# Patient Record
Sex: Female | Born: 1965 | Race: Black or African American | Hispanic: No | Smoking: Never smoker
Health system: Southern US, Community
[De-identification: ages and names within clinical notes are randomized; demographics above are authoritative.]

## PROBLEM LIST (undated history)

## (undated) DIAGNOSIS — R739 Hyperglycemia, unspecified: Secondary | ICD-10-CM

## (undated) DIAGNOSIS — E119 Type 2 diabetes mellitus without complications: Secondary | ICD-10-CM

## (undated) DIAGNOSIS — I10 Essential (primary) hypertension: Secondary | ICD-10-CM

## (undated) HISTORY — DX: Hyperglycemia, unspecified: R73.9

## (undated) HISTORY — DX: Essential (primary) hypertension: I10

## (undated) HISTORY — DX: Type 2 diabetes mellitus without complications: E11.9

---

## 1998-03-18 ENCOUNTER — Ambulatory Visit (HOSPITAL_COMMUNITY): Admission: RE | Admit: 1998-03-18 | Discharge: 1998-03-18 | Payer: Self-pay | Admitting: Obstetrics and Gynecology

## 1998-06-01 ENCOUNTER — Ambulatory Visit (HOSPITAL_COMMUNITY): Admission: RE | Admit: 1998-06-01 | Discharge: 1998-06-01 | Payer: Self-pay | Admitting: Family Medicine

## 1998-10-21 ENCOUNTER — Ambulatory Visit (HOSPITAL_COMMUNITY): Admission: RE | Admit: 1998-10-21 | Discharge: 1998-10-21 | Payer: Self-pay | Admitting: Obstetrics and Gynecology

## 1998-10-22 ENCOUNTER — Ambulatory Visit (HOSPITAL_COMMUNITY): Admission: RE | Admit: 1998-10-22 | Discharge: 1998-10-22 | Payer: Self-pay | Admitting: Obstetrics and Gynecology

## 1998-10-22 ENCOUNTER — Encounter: Payer: Self-pay | Admitting: Obstetrics and Gynecology

## 1998-10-23 ENCOUNTER — Ambulatory Visit (HOSPITAL_COMMUNITY): Admission: RE | Admit: 1998-10-23 | Discharge: 1998-10-23 | Payer: Self-pay | Admitting: Obstetrics and Gynecology

## 1998-10-25 ENCOUNTER — Encounter: Payer: Self-pay | Admitting: Obstetrics and Gynecology

## 1998-10-25 ENCOUNTER — Ambulatory Visit (HOSPITAL_COMMUNITY): Admission: RE | Admit: 1998-10-25 | Discharge: 1998-10-25 | Payer: Self-pay | Admitting: Obstetrics and Gynecology

## 1998-11-10 ENCOUNTER — Ambulatory Visit (HOSPITAL_COMMUNITY): Admission: RE | Admit: 1998-11-10 | Discharge: 1998-11-10 | Payer: Self-pay | Admitting: Obstetrics and Gynecology

## 1999-09-08 ENCOUNTER — Ambulatory Visit (HOSPITAL_COMMUNITY): Admission: RE | Admit: 1999-09-08 | Discharge: 1999-09-08 | Payer: Self-pay | Admitting: Obstetrics and Gynecology

## 1999-09-08 ENCOUNTER — Encounter (INDEPENDENT_AMBULATORY_CARE_PROVIDER_SITE_OTHER): Payer: Self-pay

## 1999-10-05 ENCOUNTER — Other Ambulatory Visit: Admission: RE | Admit: 1999-10-05 | Discharge: 1999-10-05 | Payer: Self-pay | Admitting: Obstetrics and Gynecology

## 2000-05-18 ENCOUNTER — Encounter: Payer: Self-pay | Admitting: Obstetrics and Gynecology

## 2000-05-18 ENCOUNTER — Ambulatory Visit (HOSPITAL_COMMUNITY): Admission: RE | Admit: 2000-05-18 | Discharge: 2000-05-18 | Payer: Self-pay | Admitting: Obstetrics and Gynecology

## 2000-08-21 ENCOUNTER — Inpatient Hospital Stay (HOSPITAL_COMMUNITY): Admission: AD | Admit: 2000-08-21 | Discharge: 2000-08-21 | Payer: Self-pay | Admitting: Obstetrics and Gynecology

## 2000-10-22 ENCOUNTER — Inpatient Hospital Stay (HOSPITAL_COMMUNITY): Admission: AD | Admit: 2000-10-22 | Discharge: 2000-10-26 | Payer: Self-pay | Admitting: Obstetrics and Gynecology

## 2000-11-22 ENCOUNTER — Other Ambulatory Visit: Admission: RE | Admit: 2000-11-22 | Discharge: 2000-11-22 | Payer: Self-pay | Admitting: Obstetrics and Gynecology

## 2001-12-06 ENCOUNTER — Other Ambulatory Visit: Admission: RE | Admit: 2001-12-06 | Discharge: 2001-12-06 | Payer: Self-pay | Admitting: Obstetrics and Gynecology

## 2002-12-11 ENCOUNTER — Other Ambulatory Visit: Admission: RE | Admit: 2002-12-11 | Discharge: 2002-12-11 | Payer: Self-pay | Admitting: Obstetrics and Gynecology

## 2003-06-23 ENCOUNTER — Encounter: Admission: RE | Admit: 2003-06-23 | Discharge: 2003-09-21 | Payer: Self-pay | Admitting: Family Medicine

## 2003-12-15 ENCOUNTER — Other Ambulatory Visit: Admission: RE | Admit: 2003-12-15 | Discharge: 2003-12-15 | Payer: Self-pay | Admitting: Obstetrics and Gynecology

## 2004-05-24 ENCOUNTER — Inpatient Hospital Stay (HOSPITAL_COMMUNITY): Admission: AD | Admit: 2004-05-24 | Discharge: 2004-05-24 | Payer: Self-pay | Admitting: Obstetrics and Gynecology

## 2004-05-24 ENCOUNTER — Ambulatory Visit (HOSPITAL_COMMUNITY): Admission: RE | Admit: 2004-05-24 | Discharge: 2004-05-24 | Payer: Self-pay | Admitting: Obstetrics and Gynecology

## 2004-10-21 ENCOUNTER — Ambulatory Visit (HOSPITAL_COMMUNITY): Admission: RE | Admit: 2004-10-21 | Discharge: 2004-10-21 | Payer: Self-pay | Admitting: Obstetrics and Gynecology

## 2004-12-23 ENCOUNTER — Other Ambulatory Visit: Admission: RE | Admit: 2004-12-23 | Discharge: 2004-12-23 | Payer: Self-pay | Admitting: Obstetrics and Gynecology

## 2005-03-02 ENCOUNTER — Encounter (HOSPITAL_COMMUNITY): Admission: EM | Admit: 2005-03-02 | Discharge: 2005-04-01 | Payer: Self-pay | Admitting: Obstetrics and Gynecology

## 2005-07-21 IMAGING — US US OB TRANSVAGINAL MODIFY
1 series · 18 of 28 positions shown · non-contrast
Comparison: none

CLINICAL DATA: Threatened AB.  5 week 4 days pregnant per office (patient states her LMP is 04/28/04 = 3 weeks 5 days).  Beta hCG level 3388 on 05/18/04 and 2258 on 05/20/04.  
EARLY OBSTETRICAL ULTRASOUND WITH TRANSVAGINAL:
Transabdominal and transvaginal scanning of the pelvis was performed.  The uterus is enlarged and contains multiple fibroids, several of which are calcified.  No intrauterine pregnancy is identified.  The endometrium is normal in thickness at 5.9 mm.  
Left ovary is normal measuring 3.9 x 1.5 x 1.1 cm.  Right ovary is 3.5 x 1.9 cm.  It contains a 14 x 17 mm hypoechoic area consistent with a hemorrhagic corpus luteum.  
  Adjacent to the right ovary, there is a complex cystic/solid mass measuring 3.2 x 2.5 x 2.5 cm.  Within the central cystic portion of the mass, there is a questionable yolk sac.  The appearance is highly suspicious for ectopic pregnancy.  No free pelvic fluid is noted.  
IMPRESSION
There is no evidence of intrauterine pregnancy.  There is a complex cystic/solid mass adjacent to the right ovary, measuring 3.2 x 2.5 x 2.5 cm.  The appearance is highly suspicious for ectopic pregnancy.
Corpus luteum in the right ovary.  Normal left ovary.  
No free pelvic fluid.
The report was discussed with Dr. Goya.  He asked that the patient be sent to the office for further evaluation.

[Series 1: us ob transvaginal modify · 18 of 52 slices shown]
[im 1/52]
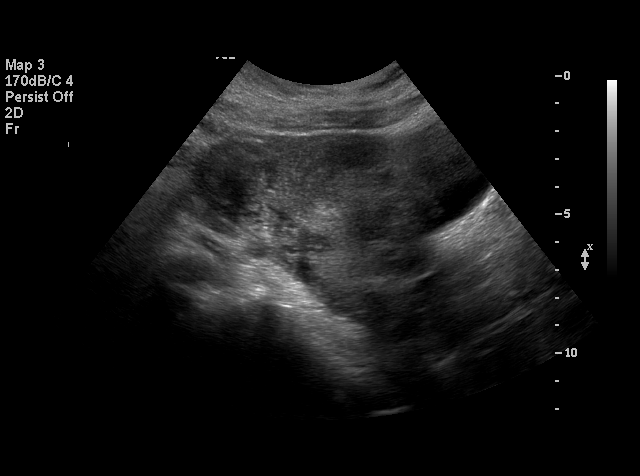
[im 4/52]
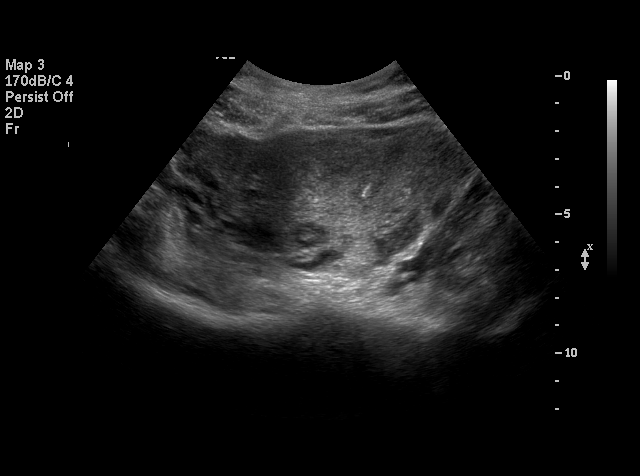
[im 6/52]
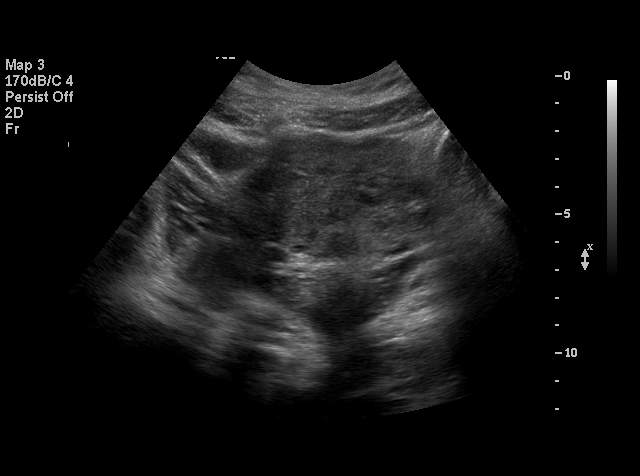
[im 10/52]
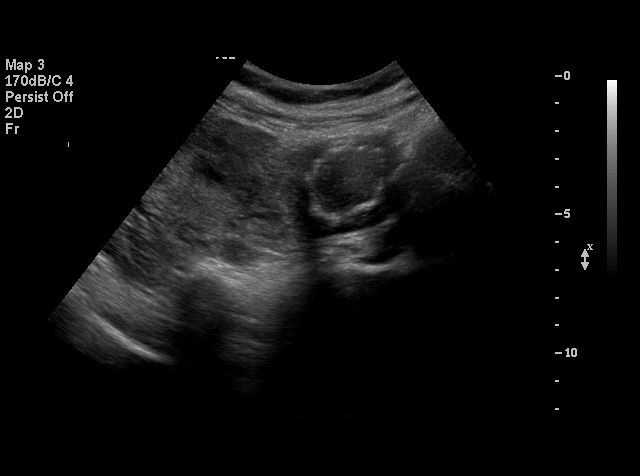
[im 14/52]
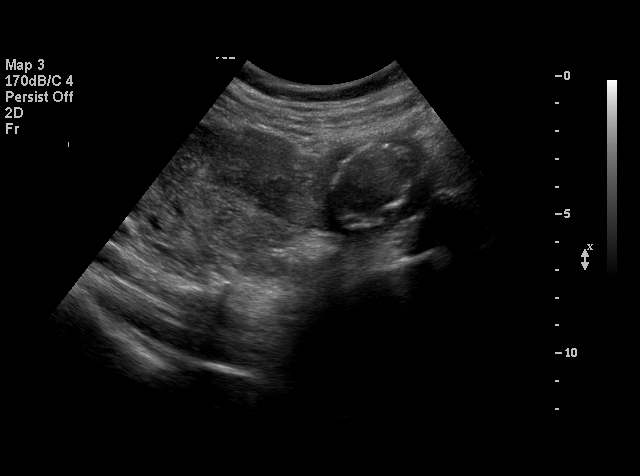
[im 16/52]
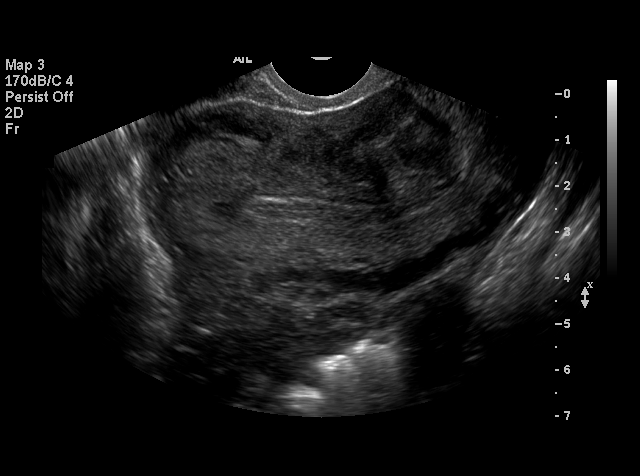
[im 19/52]
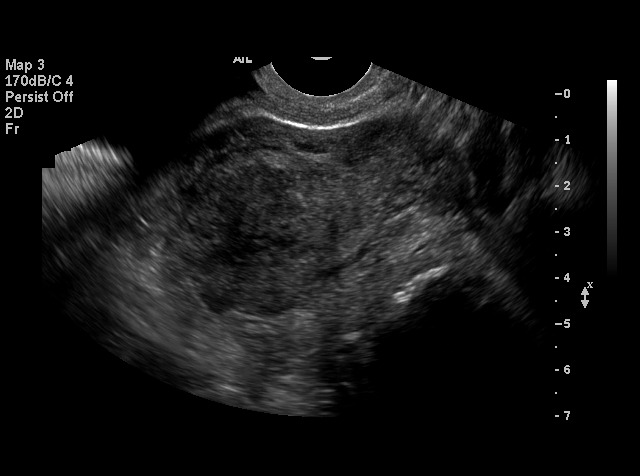
[im 21/52]
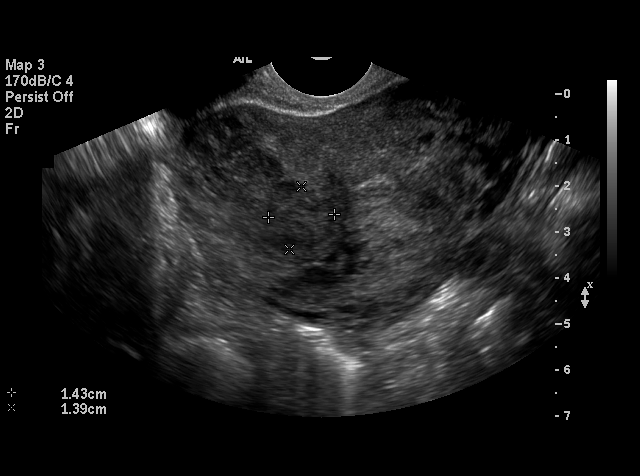
[im 25/52]
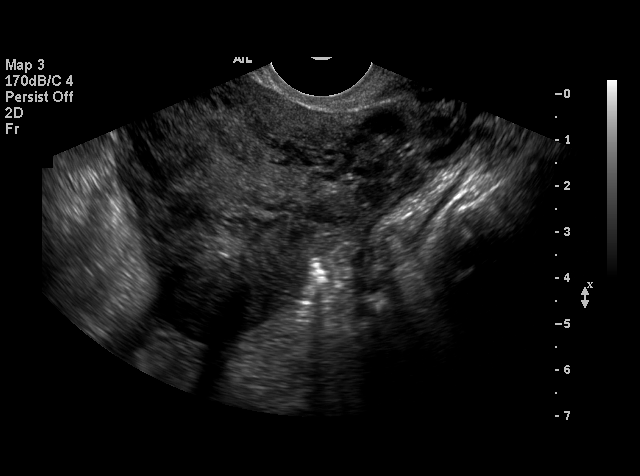
[im 27/52]
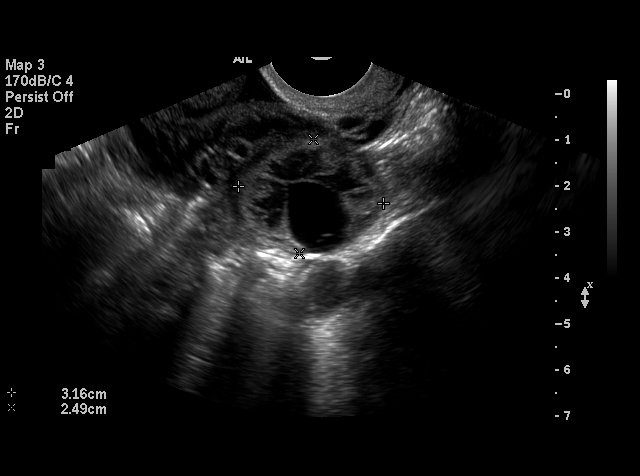
[im 31/52]
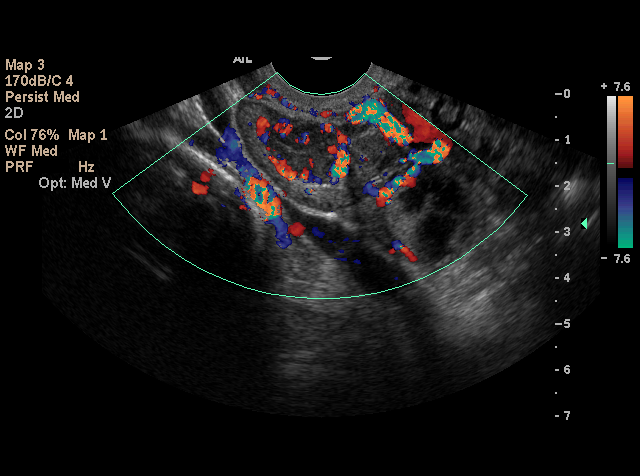
[im 33/52]
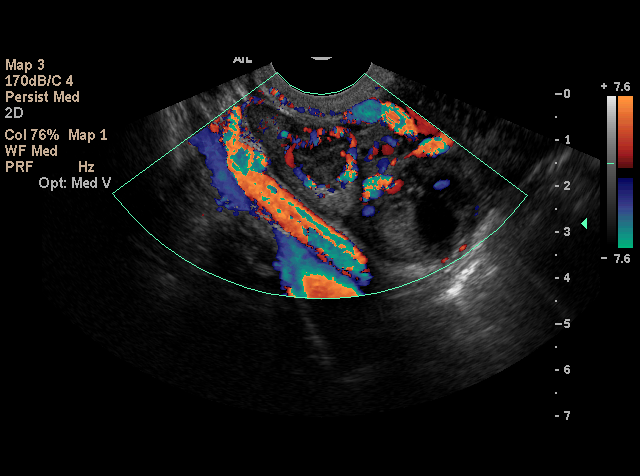
[im 36/52]
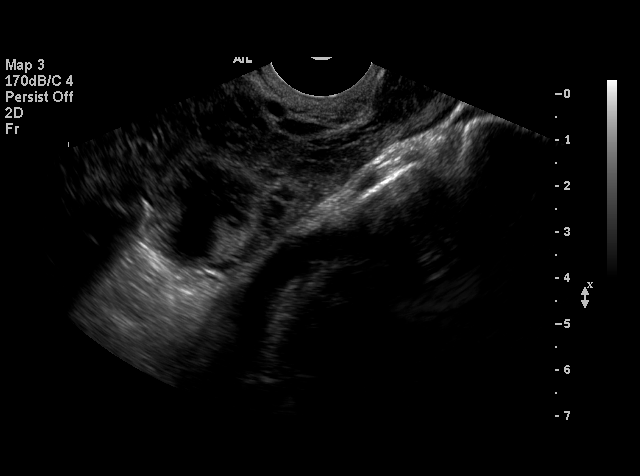
[im 40/52]
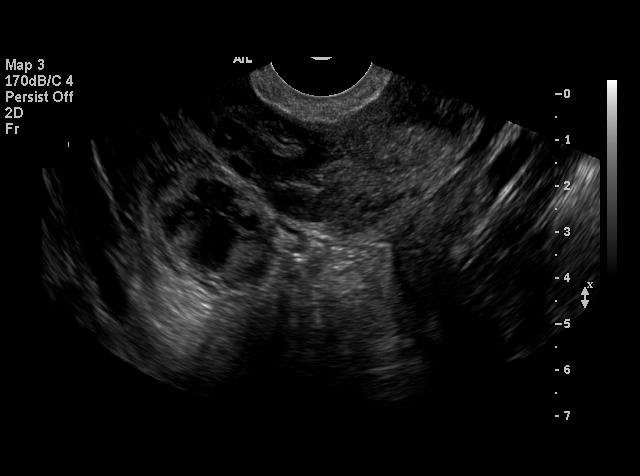
[im 42/52]
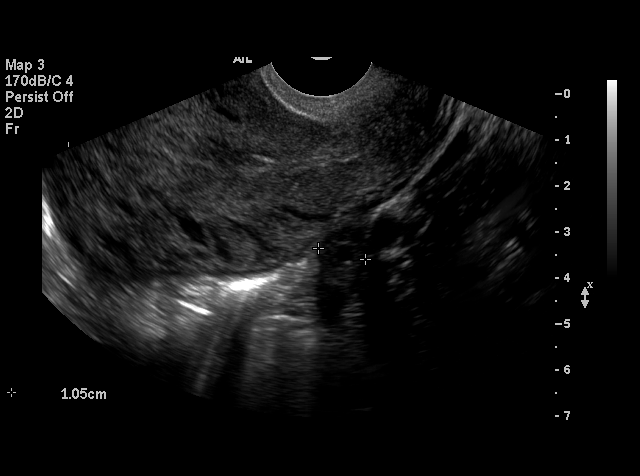
[im 46/52]
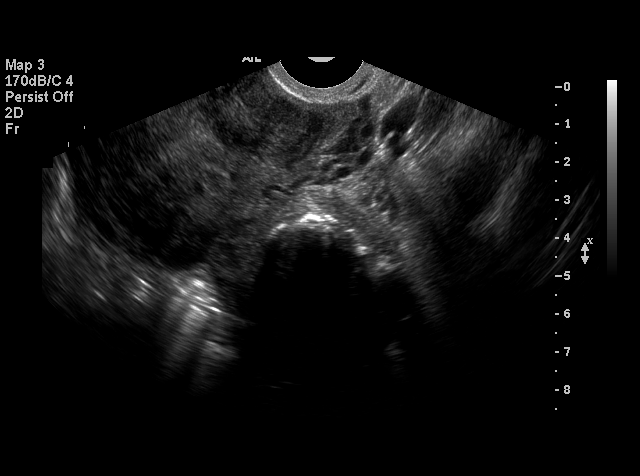
[im 48/52]
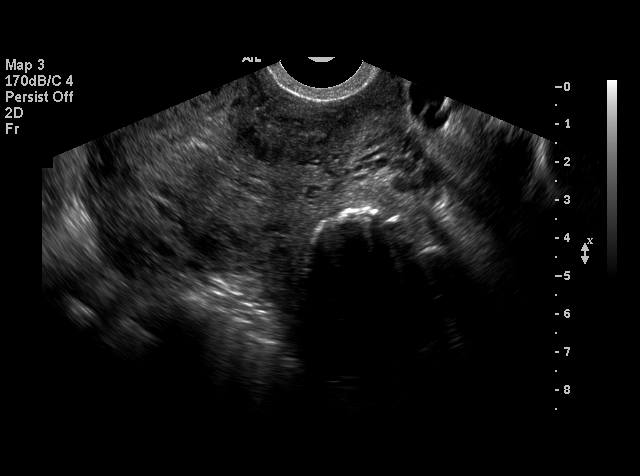
[im 52/52]
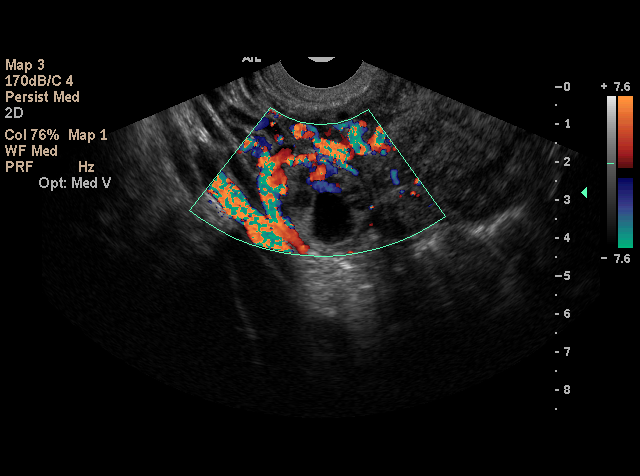

[18 of 28 positions shown; findings below may reference images not displayed]

## 2005-08-08 ENCOUNTER — Encounter: Admission: RE | Admit: 2005-08-08 | Discharge: 2005-09-04 | Payer: Self-pay | Admitting: Obstetrics and Gynecology

## 2005-10-10 ENCOUNTER — Inpatient Hospital Stay (HOSPITAL_COMMUNITY): Admission: RE | Admit: 2005-10-10 | Discharge: 2005-10-13 | Payer: Self-pay | Admitting: Obstetrics and Gynecology

## 2005-10-10 ENCOUNTER — Encounter (INDEPENDENT_AMBULATORY_CARE_PROVIDER_SITE_OTHER): Payer: Self-pay | Admitting: *Deleted

## 2005-12-19 ENCOUNTER — Other Ambulatory Visit: Admission: RE | Admit: 2005-12-19 | Discharge: 2005-12-19 | Payer: Self-pay | Admitting: Obstetrics and Gynecology

## 2008-09-09 ENCOUNTER — Emergency Department (HOSPITAL_BASED_OUTPATIENT_CLINIC_OR_DEPARTMENT_OTHER): Admission: EM | Admit: 2008-09-09 | Discharge: 2008-09-09 | Payer: Self-pay | Admitting: Emergency Medicine

## 2011-03-31 NOTE — Op Note (Signed)
[name], [name] NO.:  [id number]   MEDICAL RECORD NO.:  [id number]          PATIENT TYPE:  INP   LOCATION:  9117                          FACILITY:  [facility]   PHYSICIAN:  Samantha James, M.D.       DATE OF BIRTH:  May 03, 1966   DATE OF PROCEDURE:  10/10/2005  DATE OF DISCHARGE:                                 OPERATIVE REPORT   PREOPERATIVE DIAGNOSIS:  Intrauterine pregnancy at 38-1/2 weeks.  Insulin-  dependent gestational diabetes, desired sterilization.   POSTOPERATIVE DIAGNOSIS:  Intrauterine pregnancy at 38-1/2 weeks.  Insulin-  dependent gestational diabetes, desired sterilization. With delivery of  viable female infant Apgar 09/09.   PROCEDURE:  Repeat low flap transverse cesarean section, bilateral Pomeroy  tubal sterilization.   SURGEON:  Dr. Miguel James.   ASSISTANT:  Carrington Clamp, M.D.   ANESTHESIA:  Spinal.   COMPLICATIONS:  None.   JUSTIFICATION:  The patient is a 45 year old black female gravida 6, para 1-  0-4-1 at 38-1/2 weeks. The patient has been followed during her pregnancy  and has insulin-dependent gestational diabetes, well controlled with  medication and diet. At 38-1/2 weeks having had previous cesarean section,  she is being admitted to undergo elective repeat cesarean section. In  addition she has requested that a sterilization procedure be performed and  has given informed consent for tubal sterilization.   DESCRIPTION OF PROCEDURE:  The patient was taken to the operating room and  placed in sitting position and spinal anesthesia was administered without  difficulty. She was then placed in supine position, deviated to the left,  and prepped and draped in usual sterile fashion. The Foley catheter was  inserted. The previous Pfannenstiel incision was then reincised and incision  was extended down to subcutaneous tissue with bleeding points being clamped  and coagulated as they were encountered. The fascia was then identified,  incised transversely. At this point the fascia was separated from the  underlying rectus muscles. Rectus muscles were divided in midline.  Peritoneum was then found and entered carefully avoiding underlying  structures. At this point a bladder flap was created and protected with a  bladder blade. An incision was made into lower uterine segment and amniotic  cavity was entered. Clear fluid was obtained and at this point the patient  was delivered of viable female infant Apgar 9 at 1 minute and 9 at 5 minutes  from vertex LOA position. Baby was handed to pediatric team in attendance.  Cord bloods were obtained for appropriate studies. After this was done the  placenta was delivered. Uterus was evacuated of any remaining products of  conception and the angles of the uterine incision were ligated using figure-  of-eight sutures of #1 Vicryl. The uterus was closed in layers. The first  layer was running interlocking suture of #1 Vicryl followed by an  imbricating suture of #1 Vicryl. After this was done bladder flap was  reapproximated using running continuous 2-0 Vicryl suture. At this point  attention was directed to the right fallopian tube. It was traced out to  fimbriated end  and was then grasped in its midportion. The segment of tube  was then ligated with two ligatures of 0 plain gut. The tubal segment above  the ligatures was excised and tubal stumps were cauterized. The identical  procedure was then carried out on the left side. 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 At this point lap counts, instrument counts were taken  and found to be correct and then the abdomen was closed. The  parietoperitoneum was closed using running continuous 0 Vicryl suture.  Rectus muscles were reapproximated using running continuous 0 Vicryl suture.  The fascia was closed using two sutures of 0 Vicryl each starting at the   lateral fascial angles and meeting in the midline. Subcutaneous tissue was  closed using interrupted 0 Vicryl suture and skin incision was closed using  staples. Estimated blood loss was approximately 800 mL. The patient  tolerated the procedure well and went to the recovery room in satisfactory  condition.      Samantha James, M.D.  Electronically Signed     AR/MEDQ  D:  10/10/2005  T:  10/10/2005  Job:  [id number]

## 2011-03-31 NOTE — Discharge Summary (Signed)
Samantha James, Samantha James NO.:  [id number]   MEDICAL RECORD NO.:  [id number]          PATIENT TYPE:  INP   LOCATION:  9117                          FACILITY:  [facility]   PHYSICIAN:  Samantha James, M.D.   DATE OF BIRTH:  [birth year]   DATE OF ADMISSION:  10/10/2005  DATE OF DISCHARGE:  10/13/2005                                 DISCHARGE SUMMARY   FINAL DIAGNOSES:  1.  Intrauterine pregnancy at 38.[redacted] weeks gestation.  2.  Insulin dependent gestational diabetes.  3.  The patient desires permanent sterilization.  4.  History of cesarean section.  5.  History of uterine fibroids.   PROCEDURE:  Repeat low flap transverse cesarean section and bilateral  Pomeroy tubal sterilization procedure. Surgeon Dr. Donovan Kail.  Assistant  Dr. Carrington Clamp.   COMPLICATIONS:  None.   HISTORY OF PRESENT ILLNESS:  This 45 year old G6, P 1-0-4-1 presents at 38.[redacted]  weeks gestation for repeat cesarean section. The patient's antepartum course  had been complicated by advanced maternal age.  The patient declined  amniocentesis but had a quad screen performed that came back within normal  limits. The patient was also noted to have gestational diabetes on her  screening and insulin was needed to control this. She was monitored with  nonstress tests which were always reactive. The patient had a negative group  B strep culture obtained in the office at 35 weeks, and she was felt ready  for delivery at 38.5 weeks.  She was taken to the operating room on October 10, 2005 by Dr. Donovan Kail where a repeat low flap transverse cesarean  section was performed with the delivery of an 8 pound 2 ounce female infant  with Apgars of 9/9.  Delivery went without complications.  At this point,  the patient expressed her desire to continue with a bilateral tubal  ligation. This was performed without complications. The patient's  postoperative course was complicated by some postoperative anemia, but the  patient was stable.  Her hemoglobin dropped as low as 7.5, but it was only  9.8 preop. She was felt ready for discharge on postoperative day #3.   DIET:  She was sent home on a regular diet.   ACTIVITY:  She was told to decrease her activities.   MEDICATIONS:  1.  She was told to continue her prenatal vitamins and iron supplement 3      times a day.  2.  She could use Percocet 1-2 every 4 hours as needed for pain.  3.  She was told she could use over-the-counter ibuprofen up to 600      milligrams every 6 hours as needed for pain.   FOLLOW UP:  She was to follow up in the office in 4 weeks.   LABORATORY DATA ON DISCHARGE:  Hemoglobin of 7.5, white blood cell count of  10.3, platelets of 179,000 and normal PIH panel.      Samantha James, P.A.-C.      Samantha James, M.D.  Electronically Signed    MB/MEDQ  D:  11/26/2005  T:  11/27/2005  Job:  [id number]

## 2011-03-31 NOTE — Op Note (Signed)
[facility] [place]  Patient:    Samantha James, Samantha James                        MRN: [id number] Proc. Date: 10/23/00 Adm. Date:  [id number] Attending:  Conley Simmonds A                           Operative Report  PREOPERATIVE DIAGNOSIS:       Intrauterine pregnancy at term, arrest of active stage of labor.  POSTOPERATIVE DIAGNOSIS:      Intrauterine pregnancy at term, arrest of active stage of labor with delivery of viable female infant, Apgars 8 and 9.  PROCEDURE:                    Primary low flap transverse cesarean section.  SURGEON:                      Miguel Aschoff, M.D.  ANESTHESIA:                   Epidural.  COMPLICATIONS:                None.  JUSTIFICATION:                The patient is a 45 year old black female, gravida 4, para 0-0-3-0 at term.  The patient has had prior poor OB history with three ______ trimester spontaneous fetal demises.  She is now at term and being induced.  The patient progressed to 5 cm and then had arrest of labor for three hours with no further progress of dilatation or descent with adequate [place] units.  Because of this arrest of labor, she presents now to undergo primary cesarean section.  PROCEDURE:                    Patient was taken to the operating room, placed in the supine position deviated to the left and prepped and draped in the usual sterile fashion.  A Foley catheter had been previously placed.  A Pfannenstiel incision was then made, extended down through subcutaneous tissue with bleeding points being clamped and coagulated as they were encountered. Fascia was then identified and incised transversely and separated from the underlying rectus muscles.  Rectus muscles were divided in the midline.  The peritoneum was then identified and entered carefully avoiding underlying structures.  The peritoneal incision was then extended, the bladder flap was then created and protected with the bladder blade.  An  elliptical transverse incision was made into the lower uterine segment, the amniotic cavity was entered, clear fluid was obtained and at this point, the patient was delivered a viable female infant, Apgar 8 at 1 minute and 9 and 5 minutes from the vertex ROP position.  Head was asymmetric consistent with significant ______ which was felt to be responsible for the arrest of labor.  There was a nuchal cord x 1 noted.  The babys nose and mouth were suctioned and then the baby was handed to the pediatric team in attendance.  Cord bloods were then obtained for appropriate studies.  Then the placenta was delivered.  The uterus was then evacuated of any remaining products of conception.  The angles of the uterine incision were then ligated using figure-of-eight sutures of #1 Vicryl, then the uterus was closed in layers.  The first layer was running interlocking suture of #  1 Vicryl, followed by an imbricating suture of #1 Vicryl.  The bladder flap was reapproximated using running continuous 2-0 Vicryl suture.  At this point, inspection was made for hemostasis.  Hemostasis was excellent.  Laparotomy counts were taken and found to be correct. Inspection of the uterus showed multiple subserosal fibroids.  Several of them being 5-6 cm in size.  The tubes and ovaries were noted to be within normal limits.  At this point, the abdomen was closed.  The parietal peritoneum was closed using running continuous 0 Vicryl suture.  Rectus muscles were reapproximated using running continuous 0 Vicryl suture.  The fascia was closed using two sutures of 0 Vicryl each starting at the lateral fascial angles and meeting in the midline.  Subcutaneous tissue and skin were closed using staples.  The estimated blood loss was approximately 600 to 700 cc. The patient tolerated the procedure well and went to the recovery room in satisfactory condition. DD:  10/23/00 TD:  10/23/00 Job: [id number]

## 2011-03-31 NOTE — Discharge Summary (Signed)
[facility]   Patient:    Samantha James, Samantha James                        MRN: [id number] Adm. Date:  [id number] Disc. Date: [id number] Attending:  Conley Simmonds A Dictator:   Leilani Able, P.A.                           Discharge Summary  FINAL DIAGNOSES:              Intrauterine pregnancy at term, arrest of the active stage of labor.  PROCEDURE:                    Primary low flap transverse cesarean section.  SURGEON:                      Miguel Aschoff, M.D.  COMPLICATIONS:                None.                                This 45 year old G4, P0-0-3-0 presents at term for induction secondary to decreased fetal movement and measuring size greater than dates secondary to uterine fibroids.  Patient has a history of prior fetal demise at 15 weeks.  She did have an NST in the office last week that was reactive.  Patient was admitted on the evening of the 10th for Cytotec and then on the 11th was begun on Pitocin protocol.  Patient also had an abnormal AFP this pregnancy but the ultrasound showed no evidence of neural tube defects.  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  On the 11th patient was started on her low dose Pitocin protocol. Patient dilated to about 5 cm by 3:30 that afternoon with no descent of the vertex and no further dilation.  She was having an adequate contraction pattern.  At this point it was felt to proceed with a cesarean section secondary to arrest of labor.  Patient was taken to the operating room by Dr. Miguel Aschoff where a primary low flap transverse cesarean section was performed with the delivery of an 8 pound 9 ounce female infant with Apgars of 8 and 9. Delivery went without complications.  There were multiple subserosal fibroids noted.  Patients postoperative course was benign without significant fevers. She was felt ready for discharge on postoperative day #3.  She was sent home on a regular diet.   Told to decrease activities.  Told to continue prenatal vitamins and FeSo4.  She was also given Motrin 600 mg #30 one q.6h. as needed for pain.  Told to follow up in the office in four weeks. DD:  11/19/00 TD:  11/19/00 Job: [id number]

## 2011-05-03 ENCOUNTER — Other Ambulatory Visit: Payer: Self-pay | Admitting: Obstetrics and Gynecology

## 2013-08-01 ENCOUNTER — Other Ambulatory Visit: Payer: Self-pay | Admitting: Obstetrics and Gynecology

## 2013-11-25 ENCOUNTER — Telehealth: Payer: Self-pay | Admitting: Hematology & Oncology

## 2013-11-25 NOTE — Telephone Encounter (Signed)
Left vm w NEW PATIENT today to remind them of their appointment with Dr. Ennever. Also, advised them to bring all meds and insurance information. ° °

## 2013-11-27 ENCOUNTER — Encounter: Payer: Self-pay | Admitting: Hematology & Oncology

## 2013-11-27 ENCOUNTER — Ambulatory Visit: Payer: BC Managed Care – PPO

## 2013-11-27 ENCOUNTER — Other Ambulatory Visit (HOSPITAL_BASED_OUTPATIENT_CLINIC_OR_DEPARTMENT_OTHER): Payer: BC Managed Care – PPO | Admitting: Lab

## 2013-11-27 ENCOUNTER — Ambulatory Visit (HOSPITAL_BASED_OUTPATIENT_CLINIC_OR_DEPARTMENT_OTHER): Payer: BC Managed Care – PPO | Admitting: Hematology & Oncology

## 2013-11-27 VITALS — BP 137/64 | HR 69 | Temp 98.2°F | Resp 14 | Ht 62.0 in | Wt 150.0 lb

## 2013-11-27 DIAGNOSIS — D649 Anemia, unspecified: Secondary | ICD-10-CM

## 2013-11-27 DIAGNOSIS — D509 Iron deficiency anemia, unspecified: Secondary | ICD-10-CM

## 2013-11-27 LAB — CBC WITH DIFFERENTIAL (CANCER CENTER ONLY)
BASO#: 0 10*3/uL (ref 0.0–0.2)
BASO%: 0.5 % (ref 0.0–2.0)
EOS%: 1.5 % (ref 0.0–7.0)
Eosinophils Absolute: 0.1 10*3/uL (ref 0.0–0.5)
HCT: 37.4 % (ref 34.8–46.6)
HEMOGLOBIN: 11.8 g/dL (ref 11.6–15.9)
LYMPH#: 2 10*3/uL (ref 0.9–3.3)
LYMPH%: 50.1 % — ABNORMAL HIGH (ref 14.0–48.0)
MCH: 23 pg — AB (ref 26.0–34.0)
MCHC: 31.6 g/dL — AB (ref 32.0–36.0)
MCV: 73 fL — ABNORMAL LOW (ref 81–101)
MONO#: 0.3 10*3/uL (ref 0.1–0.9)
MONO%: 8.3 % (ref 0.0–13.0)
NEUT#: 1.6 10*3/uL (ref 1.5–6.5)
NEUT%: 39.6 % (ref 39.6–80.0)
Platelets: 234 10*3/uL (ref 145–400)
RBC: 5.13 10*6/uL (ref 3.70–5.32)
RDW: 14.4 % (ref 11.1–15.7)
WBC: 4 10*3/uL (ref 3.9–10.0)

## 2013-11-27 LAB — IRON AND TIBC CHCC
%SAT: 14 % — ABNORMAL LOW (ref 21–57)
Iron: 49 ug/dL (ref 41–142)
TIBC: 363 ug/dL (ref 236–444)
UIBC: 313 ug/dL (ref 120–384)

## 2013-11-27 LAB — CHCC SATELLITE - SMEAR

## 2013-11-27 LAB — FERRITIN CHCC: FERRITIN: 22 ng/mL (ref 9–269)

## 2013-11-27 NOTE — Progress Notes (Signed)
This office note has been dictated.

## 2013-12-01 NOTE — Progress Notes (Signed)
CC:   Juluis RainierElizabeth Barnes, MD  DIAGNOSIS:  Microcytic anemia.  HISTORY OF PRESENT ILLNESS:  Samantha James is a very nice 48 year old African American  female.  She works for the [employer]. She is followed by Dr. Juluis RainierElizabeth Barnes.  Dr. Zachery DauerBarnes has noted that Samantha James has had microcystic indices for her red cells.  Samantha James really has not had much in the way of anemia.  Samantha James says that she does have erratic monthly cycles.  She thinks she is probably is going through the change of life.  She sees Dr. Miguel AschoffAllan Ross of OB/GYN.  Samantha James does have non insulin-dependent diabetes.  Her blood work going back to on November 2014 showed a white cell count of 4.1, hemoglobin 12, hematocrit 37.9, platelet count was 218.  She had a MCV of 73.  Her white cell differential showed 44 segs, 47 lymphocytes, 8 monos.  Again, she has not had any bleeding otherwise.  She has had no change in bowel or bladder habits.  She has had no weight loss or weight gain. She is not a vegetarian.  She has had no rashes.  There has been no cough.  She has had no bony pain.  There has been no dysphagia or odynophagia.  She does have her mammograms routinely.  She has not had a colonoscopy yet.  We were currently asked to see her to try to help identify this microcytic abnormality with her red cells.  PAST MEDICAL HISTORY:  Remarkable for; 1. Non insulin-dependent diabetes. 2. Hyperlipidemia. 3. Vitamin D deficiency.  ALLERGIES:  Penicillin.  MEDICATIONS:  Mevacor 20 mg p.o. daily, Glucophage 500 mg p.o. b.i.d., multivitamins.  SOCIAL HISTORY:  Negative for tobacco use.  There is no real alcohol use.  She has no obvious occupational exposures.  FAMILY HISTORY:  Unremarkable for obvious sickle cell.  She is not aware of anybody with anemia in the family.  I think there is some malignancy in the family.  REVIEW OF SYSTEMS:  As stated in history of present illness.  No additional findings  noted on a 12-system review.  PHYSICAL EXAMINATION:  General:  This is a well-developed, well- nourished African American female in no obvious distress.  Vital Signs: Temperature of 98, pulse 69, respiratory rate 16, blood pressure 137/64, weight is 150 pounds.  Head and Neck:  Normocephalic, atraumatic skull. There are no ocular or oral lesions.  There is no scleral icterus. Pupils are reactive.  She has no mucositis.  There is no glossitis. There is no adenopathy in the neck.  Thyroid is nonpalpable.  Lungs: Clear to percussion and auscultation bilaterally.  Cardiac:  Regular rate and rhythm with a normal S1 and S2.  There are no murmurs, rubs, or bruits.  Abdomen:  Soft.  She has good bowel sounds.  There is no fluid wave.  She has well-healed laparotomy scar from past C-sections appeared.  There is no palpable hepatosplenomegaly.  Back:  No tenderness over the spine, ribs, or hips.  Extremities:  No clubbing, cyanosis, or edema.  She has good range motion of her joints.  She has good pulses in distal extremities.  She has good strength in her arms and legs.  Skin:  No rashes, ecchymoses, or petechia.  Neurological:  No focal neurological deficits.  LABORATORY STUDIES:  White cell count is 4, hemoglobin 11.8, hematocrit 37.4, platelet count 234.  MCV is 73.  Her ferritin is at 22.  Iron saturation is 14%.  Peripheral smear shows a microcytic population of red blood cells. There is minimal anisocytosis and poikilocytosis.  I see no target cells.  She has no nucleated red blood cells.  There are no inclusion bodies.  She has no Rouleaux formation.  There are no schistocytes or spherocytes.  White cells appear normal in morphology and maturation. There is no immature myeloid or lymphoid forms.  She has no atypical lymphocytes.  I see no blasts.  Platelets are adequate in number and size.  Platelets are well granulated.  IMPRESSION:  Samantha James is a very charming 48 year old African  American female.  She is not anemic.  She is microcytic.  I think what is important is that her RDW is normal.  One would have to wonder if she does not have an underlying thalassemia with mild iron deficiency.  One would think that with iron deficiency alone, that there would be an elevated RDW.  Her iron studies do show borderline iron.  She is asymptomatic with this.  Again, she is not anemic.  We will see what the hemoglobinopathy evaluation shows.  I am also sending off an alpha thalassemia genotype assay on her.  I do not suspect anything else going on.  I do think that she may need to have her stools checked.  I would like to make sure that she is not having any other source of blood loss outside of her monthly cycles.  With her having diabetes, she may have a lower bone-marrow report response to anemia.  I have not checked her erythropoietin level; however, because I still think that would be all that important to do at this point.  I reviewed Samantha James's level with her.  I went over my recommendations with her.  Of note, she does have a mildly elevated lymphocyte percentage.  I suspect this is related to her ethnic heritage.  In about 20% of African Americans, they can have a chronically elevated lymphocyte percentage. Again, this is not clinically significant.  Again, reviewed my recommendations with Samantha James.  I went over her lab work with her.  I explained the different possibilities that could be going on.  I do not think she needs any IV iron.  Again, she is not anemic.  She is borderline iron deficient.  I think if she needed any extra iron, she might be able to take it orally.  As nice as Samantha James is , I just do not think we have to get her back to the office.  I just do not see that we would be impacting her health care.  I will give antiinfective once I get results back from hemoglobin evaluation.  I spent a good hour with Samantha James.  It was very  nice talking to her. She was very, very nice.    ______________________________ Josph Macho, M.D. PRE/MEDQ  D:  11/27/2013  T:  11/28/2013  Job:  [id number]

## 2013-12-08 LAB — HEMOGLOBINOPATHY EVALUATION
Hemoglobin Other: 0 %
Hgb A2 Quant: 2.4 % (ref 2.2–3.2)
Hgb A: 97.6 % (ref 96.8–97.8)
Hgb F Quant: 0 % (ref 0.0–2.0)
Hgb S Quant: 0 %

## 2013-12-08 LAB — ALPHA-THALASSEMIA GENOTYPR

## 2014-08-06 ENCOUNTER — Other Ambulatory Visit: Payer: Self-pay | Admitting: Obstetrics and Gynecology

## 2014-08-07 LAB — CYTOLOGY - PAP

## 2016-08-10 ENCOUNTER — Other Ambulatory Visit: Payer: Self-pay | Admitting: Obstetrics and Gynecology

## 2016-08-11 LAB — CYTOLOGY - PAP

## 2016-09-27 ENCOUNTER — Ambulatory Visit: Payer: BC Managed Care – PPO | Attending: Orthopedic Surgery | Admitting: Physical Therapy

## 2016-09-27 ENCOUNTER — Encounter: Payer: Self-pay | Admitting: Physical Therapy

## 2016-09-27 DIAGNOSIS — M6281 Muscle weakness (generalized): Secondary | ICD-10-CM

## 2016-09-27 DIAGNOSIS — M25661 Stiffness of right knee, not elsewhere classified: Secondary | ICD-10-CM | POA: Insufficient documentation

## 2016-09-27 DIAGNOSIS — M25671 Stiffness of right ankle, not elsewhere classified: Secondary | ICD-10-CM | POA: Insufficient documentation

## 2016-09-27 DIAGNOSIS — M25672 Stiffness of left ankle, not elsewhere classified: Secondary | ICD-10-CM

## 2016-09-27 NOTE — Patient Instructions (Addendum)
Strengthening: Quadriceps Set    Tighten muscles on top of thighs by pushing knees down into surface.  Watch for knee cap pulling up. Hold __10__ seconds. Repeat __10__ times per set. Do __1__ sets per session. Do ___1_ sessions per day.  Strengthening: Hip Abductor - Resisted    With band looped around both legs above knees, push thighs apart. Keep belly tight, buttocks flat.  Repeat _10___ times per set. Do __1__ sets per session.  Then  Repeat 2x10 single legs Do __1_ sessions per day.  Balance: Unilateral - [employer] - do next to a counter for safety.     Stand on right foot, hands on hips. Keeping hips level, bend forward as if to touch forehead to wall. Hold __1__ seconds. Relax. Repeat __10__ times per set. Do __3__ sets per session. Do __1__ sessions per day.  Quads / HF, Prone    Lie face down, knees together. Grasp one ankle with same-side hand. Use towel if needed to reach. Gently pull foot toward buttock. Hold __45-60_ seconds. Repeat _1-2__ times per session. Do _1__ sessions per day. Repeat on the other leg.   Gastroc Stretch    Stand with right foot back, leg straight, forward leg bent. Keeping heel on floor, turned slightly out, lean into wall until stretch is felt in calf. Hold __45__ seconds. Repeat __1-2__ times per set. Do __1__ sets per session. Do __1__ sessions per day. Repeat on both legs.  Copyright  VHI. All rights reserved.

## 2016-09-27 NOTE — Therapy (Signed)
[facility] [address]  [address] [place], [place], [address] Phone: [phone]   Fax:  [phone]  Physical Therapy Evaluation  Patient Details  Name: Samantha James MRN: [id number] Date of Birth: 05/20/1966 Referring Provider: Dr Thurston Hole  Encounter Date: 09/27/2016      PT End of Session - 09/27/16 0953    Visit Number 1   Number of Visits 8   Date for PT Re-Evaluation 10/25/16   PT Start Time 0849   PT Stop Time 0953   PT Time Calculation (min) 64 min   Activity Tolerance Patient tolerated treatment well      History reviewed. No pertinent past medical history.  History reviewed. No pertinent surgical history.  There were no vitals filed for this visit.       Subjective Assessment - 09/27/16 0852    Subjective Pt reports h/o Rt knee issues for years after falling down stairs, had injection in Jan 2017 as the knee was getting worse. It didn't seem to help so she has a scope 08/23/16. Since surgery, she has been doing some HEP.  Her concern is she is not sure she is doing them correcltly and not as consistent as she should be.  Currnently finishing up her prednisone and going to start  mobic.    Patient Stated Goals run to chase her dog if needed, walk for exercise , squat and lift things and feel safe.    Currently in Pain? No/denies  reports stiffness in her knee after sitting for while.             [facility] PT Assessment - 09/27/16 0001      Assessment   Medical Diagnosis Rt knee scope, menisectomies   Referring Provider Dr Thurston Hole   Onset Date/Surgical Date 08/23/16   Next MD Visit 10/17/16   Prior Therapy none recent     Precautions   Precautions None     Balance Screen   Has the patient fallen in the past 6 months No     Home Environment   Living Environment Private residence   Home Layout One level  alternates     Prior Function   Level of Independence Independent   Vocation Full time  employment   Vocation Requirements desk job   Leisure walk     Observation/Other Assessments   Focus on Therapeutic Outcomes (FOTO)  42% limited     Observation/Other Assessments-Edema    Edema --  (+) edema in knee RT      Functional Tests   Functional tests Squat;Single leg stance     Squat   Comments adduction of LEs, poor eccentric quads     Single Leg Stance   Comments bilat WNL     ROM / Strength   AROM / PROM / Strength AROM;Strength     AROM   AROM Assessment Site Hip;Knee;Ankle   Right/Left Hip --  bilat WNL   Right/Left Knee Right   Right Knee Extension -9  passive -3   Right Knee Flexion 130  Lt 148   Right/Left Ankle Left;Right  bilat WNL   Right Ankle Dorsiflexion -3  passive 0   Left Ankle Dorsiflexion 3     Strength   Strength Assessment Site Hip;Knee;Ankle   Right/Left Hip Right   Right Hip Flexion 4+/5   Right Hip Extension 5/5   Right Hip ABduction 4/5   Right/Left Knee --  Rt 4+/5, poor quad contraction with  quad sets   Right/Left Ankle --     Flexibility   Soft Tissue Assessment /Muscle Length yes   Hamstrings bilat ~ 90 degrees   Quadriceps prone knee flex Lt 125, Rt 125                   [facility] Adult PT Treatment/Exercise - 09/27/16 0001      Exercises   Exercises Knee/Hip     Knee/Hip Exercises: Stretches   Quad Stretch Right;60 seconds  prone with strap   Gastroc Stretch Both;1 rep;30 seconds     Knee/Hip Exercises: Standing   SLS with FWD leans Rt LE      Knee/Hip Exercises: Supine   Quad Sets Strengthening;Right;10 reps  10 sec holds   Other Supine Knee/Hip Exercises clams with red band bilat hips and single      Modalities   Modalities Vasopneumatic     Vasopneumatic   Number Minutes Vasopneumatic  15 minutes   Vasopnuematic Location  Knee  Rt   Vasopneumatic Pressure Medium   Vasopneumatic Temperature  3*                PT Education - 09/27/16 0927    Education provided Yes   Education  Details HEP   Person(s) Educated Patient   Methods Explanation;Demonstration;Handout   Comprehension Returned demonstration             PT Long Term Goals - 09/27/16 0958      PT LONG TERM GOAL #1   Title I with advanced HEP ( 10/25/16)    Time 4   Period Weeks   Status New     PT LONG TERM GOAL #2   Title tolerate short jogging to simulate chasing her dog with no Rt knee pain ( 10/25/16)    Time 4   Period Weeks   Status New     PT LONG TERM GOAL #3   Title improve bilat ankle dorsiflexion =/> 15 degrees passively ( 10/25/16)    Time 4   Period Weeks   Status New     PT LONG TERM GOAL #4   Title improve Rt knee motion -3 degrees extension to 140 degrees flexion ( 10/25/16)    Time 4   Period Weeks   Status New     PT LONG TERM GOAL #5   Title perform squat with good form and good eccentric quad control ( 10/25/16)    Time 4   Period Weeks     Additional Long Term Goals   Additional Long Term Goals Yes     PT LONG TERM GOAL #6   Title demo prone quad flexibility to within 3" heel to buttock bilaterally ( 10/25/16)    Time 4   Period Weeks   Status New     PT LONG TERM GOAL #7   Title improve FOTO =/< 30% limited ( 10/25/16)    Time 4   Period Weeks   Status New               Plan - 09/27/16 0933    Clinical Impression Statement 50 yo presents 1 month s/p Rt knee scope for menisectomies. She has been performing some exercise at home however still feels limitations and fear to perform all her activity. She has some limited Rt knee motion compared to Lt, weakness in her Rt quad and hip and tightness in her ankles.  There is also edema still present in the Rt knee.  Rehab Potential Excellent   PT Frequency 2x / week   PT Duration 4 weeks   PT Treatment/Interventions Moist Heat;Ultrasound;Therapeutic exercise;Dry needling;Taping;Vasopneumatic Device;Manual techniques;Patient/family education   PT Next Visit Plan progress quad  control/strengthening, Rt knee and ankle ROM    Consulted and Agree with Plan of Care Patient      Patient will benefit from skilled therapeutic intervention in order to improve the following deficits and impairments:  Increased edema, Decreased strength, Pain, Decreased range of motion, Decreased activity tolerance  Visit Diagnosis: Stiffness of right knee, not elsewhere classified - Plan: PT plan of care cert/re-cert  Muscle weakness (generalized) - Plan: PT plan of care cert/re-cert  Stiffness of right ankle, not elsewhere classified - Plan: PT plan of care cert/re-cert  Stiffness of left ankle, not elsewhere classified - Plan: PT plan of care cert/re-cert     Problem List There are no active problems to display for this patient.   Samantha James PT  09/27/2016, 4:06 PM  [facility] [place] [address]  [address] [place], [place], [address] Phone: [phone]   Fax:  [phone]  Name: Samantha James MRN: [id number] Date of Birth: 10-22-1966

## 2016-10-03 ENCOUNTER — Ambulatory Visit: Payer: BC Managed Care – PPO | Admitting: Physical Therapy

## 2016-10-03 DIAGNOSIS — M25671 Stiffness of right ankle, not elsewhere classified: Secondary | ICD-10-CM

## 2016-10-03 DIAGNOSIS — M25661 Stiffness of right knee, not elsewhere classified: Secondary | ICD-10-CM

## 2016-10-03 DIAGNOSIS — M6281 Muscle weakness (generalized): Secondary | ICD-10-CM

## 2016-10-03 DIAGNOSIS — M25672 Stiffness of left ankle, not elsewhere classified: Secondary | ICD-10-CM

## 2016-10-03 NOTE — Therapy (Signed)
[facility] [place] [address]  [address] [place], [place], [address] Phone: [phone]   Fax:  [phone]  Physical Therapy Treatment  Patient Details  Name: Samantha DemarkKaron H Kraemer MRN: [id number] Date of Birth: August 19, 1966 Referring Provider: Dr Thurston HoleWainer  Encounter Date: 10/03/2016      PT End of Session - 10/03/16 1335    Visit Number 2   Number of Visits 8   Date for PT Re-Evaluation 10/25/16   PT Start Time 1310   PT Stop Time 1409   PT Time Calculation (min) 59 min   Activity Tolerance Patient limited by pain   Behavior During Therapy [facility] for tasks assessed/performed      No past medical history on file.  No past surgical history on file.  There were no vitals filed for this visit.      Subjective Assessment - 10/03/16 1309    Subjective Patient feels a tightness in her R knee. Has greatest difficulty with running/jogging - fearful   Patient Stated Goals run to chase her dog if needed, walk for exercise , squat and lift things and feel safe.    Currently in Pain? No/denies  tightness - R knee                         [facility] Adult PT Treatment/Exercise - 10/03/16 0001      Knee/Hip Exercises: Stretches   Passive Hamstring Stretch Right;3 reps;30 seconds   Quad Stretch Right;3 reps;30 seconds     Knee/Hip Exercises: Aerobic   Nustep Level 4 x 6 minutes      Knee/Hip Exercises: Machines for Strengthening   Cybex Knee Extension 20# x 10  15# x 10 eccentric lowering      Knee/Hip Exercises: Standing   Side Lunges Both   Side Lunges Limitations side stepping 20 feet each direction with red tband at ankles   Step Down 10 reps;Step Height: 6"   Step Down Limitations eccentric lowering   Functional Squat 10 reps   Functional Squat Limitations TRX     Knee/Hip Exercises: Supine   Bridges Limitations x 10   Straight Leg Raises Strengthening;Right;10 reps   Straight Leg Raises Limitations VC for full  knee extension   Other Supine Knee/Hip Exercises bridge with B LE extended on peanut ball x 10    Other Supine Knee/Hip Exercises unilateral hip abducction with green tband at knee x 10 each                     PT Long Term Goals - 09/27/16 [id number]      PT LONG TERM GOAL #1   Title I with advanced HEP ( 10/25/16)    Time 4   Period Weeks   Status New     PT LONG TERM GOAL #2   Title tolerate short jogging to simulate chasing her dog with no Rt knee pain ( 10/25/16)    Time 4   Period Weeks   Status New     PT LONG TERM GOAL #3   Title improve bilat ankle dorsiflexion =/> 15 degrees passively ( 10/25/16)    Time 4   Period Weeks   Status New     PT LONG TERM GOAL #4   Title improve Rt knee motion -3 degrees extension to 140 degrees flexion ( 10/25/16)    Time 4   Period Weeks   Status New  PT LONG TERM GOAL #5   Title perform squat with good form and good eccentric quad control ( 10/25/16)    Time 4   Period Weeks     Additional Long Term Goals   Additional Long Term Goals Yes     PT LONG TERM GOAL #6   Title demo prone quad flexibility to within 3" heel to buttock bilaterally ( 10/25/16)    Time 4   Period Weeks   Status New     PT LONG TERM GOAL #7   Title improve FOTO =/< 30% limited ( 10/25/16)    Time 4   Period Weeks   Status New               Plan - 10/03/16 1336    Clinical Impression Statement PT session focusing on quad strengthening with incorporation of eccentric quad control with some discomfort during session, however, quickly resolving. Patient demonstrating reduced full knee extension with reduced quad control. Patient to continue to benefit from PT to progress function and mobility.    PT Treatment/Interventions Moist Heat;Ultrasound;Therapeutic exercise;Dry needling;Taping;Vasopneumatic Device;Manual techniques;Patient/family education   PT Next Visit Plan progress quad control/strengthening, Rt knee and ankle ROM     Consulted and Agree with Plan of Care Patient      Patient will benefit from skilled therapeutic intervention in order to improve the following deficits and impairments:  Increased edema, Decreased strength, Pain, Decreased range of motion, Decreased activity tolerance  Visit Diagnosis: Stiffness of right knee, not elsewhere classified  Muscle weakness (generalized)  Stiffness of right ankle, not elsewhere classified  Stiffness of left ankle, not elsewhere classified     Problem List There are no active problems to display for this patient.     Kipp LaurenceStephanie R Ethelwyn Gilbertson, PT, DPT 10/03/16 2:10 PM     [facility] [place] [address]  [address] [place], [place], [address] Phone: [phone]   Fax:  [phone]  Name: Samantha DemarkKaron H Escamilla MRN: [id number] Date of Birth: 02-Jan-1966

## 2016-10-04 ENCOUNTER — Ambulatory Visit: Payer: BC Managed Care – PPO | Admitting: Physical Therapy

## 2016-10-04 DIAGNOSIS — M6281 Muscle weakness (generalized): Secondary | ICD-10-CM

## 2016-10-04 DIAGNOSIS — M25661 Stiffness of right knee, not elsewhere classified: Secondary | ICD-10-CM

## 2016-10-04 DIAGNOSIS — M25671 Stiffness of right ankle, not elsewhere classified: Secondary | ICD-10-CM

## 2016-10-04 DIAGNOSIS — M25672 Stiffness of left ankle, not elsewhere classified: Secondary | ICD-10-CM

## 2016-10-04 NOTE — Therapy (Signed)
[facility] [place] [address]  [address] [place], [place], [address] Phone: [phone]   Fax:  [phone]  Physical Therapy Treatment  Patient Details  Name: Samantha James MRN: [id number] Date of Birth: February 22, 1966 Referring Provider: Dr Thurston HoleWainer  Encounter Date: 10/04/2016      PT End of Session - 10/04/16 1323    Visit Number 3   Number of Visits 8   Date for PT Re-Evaluation 10/25/16   PT Start Time 1320   PT Stop Time 1417   PT Time Calculation (min) 57 min   Activity Tolerance Patient limited by pain   Behavior During Therapy [facility] for tasks assessed/performed      No past medical history on file.  No past surgical history on file.  There were no vitals filed for this visit.      Subjective Assessment - 10/04/16 1318    Subjective Patient feels well after PT session yesterday. Feels a little tight.    Patient Stated Goals run to chase her dog if needed, walk for exercise , squat and lift things and feel safe.    Currently in Pain? No/denies                         [facility] Adult PT Treatment/Exercise - 10/04/16 1320      Knee/Hip Exercises: Stretches   Gastroc Stretch Both;3 reps;30 seconds   Other Knee/Hip Stretches foam roll to quad - feet straight/in/out - 1 min each     Knee/Hip Exercises: Aerobic   Nustep Level 6 x 6 minutes     Knee/Hip Exercises: Machines for Strengthening   Cybex Knee Extension 25# 2 x 10  20# 10 x 2 with eccentric lowering     Knee/Hip Exercises: Standing   Step Down 15 reps;Step Height: 6"   Step Down Limitations eccentric lowering   Functional Squat 15 reps   Functional Squat Limitations TRX   SLS R SLS on blue mat 3 x 30 seconds     Knee/Hip Exercises: Supine   Bridges Limitations x 15 with green tband at knees   Other Supine Knee/Hip Exercises bridge with B LE extended on peanut ball x 10; progression to bridge with hip/knee flexion on peanut ball x 10   Other Supine Knee/Hip Exercises unilateral hip abducction with green tband at knee x 10 each     Vasopneumatic   Number Minutes Vasopneumatic  15 minutes   Vasopnuematic Location  Knee   Vasopneumatic Pressure High   Vasopneumatic Temperature  coldest temp                     PT Long Term Goals - 09/27/16 [id number]      PT LONG TERM GOAL #1   Title I with advanced HEP ( 10/25/16)    Time 4   Period Weeks   Status New     PT LONG TERM GOAL #2   Title tolerate short jogging to simulate chasing her dog with no Rt knee pain ( 10/25/16)    Time 4   Period Weeks   Status New     PT LONG TERM GOAL #3   Title improve bilat ankle dorsiflexion =/> 15 degrees passively ( 10/25/16)    Time 4   Period Weeks   Status New     PT LONG TERM GOAL #4   Title improve Rt knee motion -3 degrees extension to 140 degrees  flexion ( 10/25/16)    Time 4   Period Weeks   Status New     PT LONG TERM GOAL #5   Title perform squat with good form and good eccentric quad control ( 10/25/16)    Time 4   Period Weeks     Additional Long Term Goals   Additional Long Term Goals Yes     PT LONG TERM GOAL #6   Title demo prone quad flexibility to within 3" heel to buttock bilaterally ( 10/25/16)    Time 4   Period Weeks   Status New     PT LONG TERM GOAL #7   Title improve FOTO =/< 30% limited ( 10/25/16)    Time 4   Period Weeks   Status New               Plan - 10/04/16 1324    Clinical Impression Statement Patient today tolerating progression of hip strengthening task with no increase in pain. Patient only with complaints of mild "tightness." Patient demonstrating good eccentric quad control, however with some visible weakness. Patient to conitnue to benefit form PT to progress function and mobility.    PT Treatment/Interventions Moist Heat;Ultrasound;Therapeutic exercise;Dry needling;Taping;Vasopneumatic Device;Manual techniques;Patient/family education   PT Next Visit Plan  progress quad control/strengthening, Rt knee and ankle ROM    Consulted and Agree with Plan of Care Patient      Patient will benefit from skilled therapeutic intervention in order to improve the following deficits and impairments:  Increased edema, Decreased strength, Pain, Decreased range of motion, Decreased activity tolerance  Visit Diagnosis: Stiffness of right knee, not elsewhere classified  Muscle weakness (generalized)  Stiffness of right ankle, not elsewhere classified  Stiffness of left ankle, not elsewhere classified     Problem List There are no active problems to display for this patient.    Kipp LaurenceStephanie R Ada Woodbury, PT, DPT 10/04/16 2:18 PM    [facility] [place] [address]  [address] [place], [place], [address] Phone: [phone]   Fax:  [phone]  Name: Samantha James MRN: [id number] Date of Birth: Aug 17, 1966

## 2016-10-09 ENCOUNTER — Ambulatory Visit: Payer: BC Managed Care – PPO

## 2016-10-09 DIAGNOSIS — M25672 Stiffness of left ankle, not elsewhere classified: Secondary | ICD-10-CM

## 2016-10-09 DIAGNOSIS — M25671 Stiffness of right ankle, not elsewhere classified: Secondary | ICD-10-CM

## 2016-10-09 DIAGNOSIS — M25661 Stiffness of right knee, not elsewhere classified: Secondary | ICD-10-CM

## 2016-10-09 DIAGNOSIS — M6281 Muscle weakness (generalized): Secondary | ICD-10-CM

## 2016-10-09 NOTE — Therapy (Signed)
[facility] [place] [address]  [address] [place], [place], [address] Phone: [phone]   Fax:  [phone]  Physical Therapy Treatment  Patient Details  Name: Samantha James MRN: [id number] Date of Birth: 08/21/1966 Referring Provider: Dr Thurston HoleWainer  Encounter Date: 10/09/2016      PT End of Session - 10/09/16 1710    Visit Number 4   Number of Visits 8   Date for PT Re-Evaluation 10/25/16   PT Start Time 1705   PT Stop Time 1805   PT Time Calculation (min) 60 min   Activity Tolerance Patient limited by pain   Behavior During Therapy [facility] for tasks assessed/performed      No past medical history on file.  No past surgical history on file.  There were no vitals filed for this visit.      Subjective Assessment - 10/09/16 1747    Subjective Pt. reporting she is, "getting to the HEP activities most nights"    Patient Stated Goals run to chase her dog if needed, walk for exercise , squat and lift things and feel safe.    Currently in Pain? No/denies   Multiple Pain Sites No       Today's treatment:  Therex: Recumbent bike: lvl 2, 6 min  Side stepping with red TB 2 x 20 ft  Monster walk with red TB 2 x 20 ft  Hooklying B adduction ball squeeze with R LAQ 5" x 10 reps  HS curl + bridge with heels on peanut p-ball x 10 reps R 6" eccentric lowering 2 x 10 reps TRX deep squats 2 x 15 reps   Vasoneumatic device to R knee: supine, R LE elevated, coldest temp., medium compression, 10 min           PT Long Term Goals - 10/09/16 1711      PT LONG TERM GOAL #1   Title I with advanced HEP ( 10/25/16)    Time 4   Period Weeks   Status On-going     PT LONG TERM GOAL #2   Title tolerate short jogging to simulate chasing her dog with no Rt knee pain ( 10/25/16)    Time 4   Period Weeks   Status On-going     PT LONG TERM GOAL #3   Title improve bilat ankle dorsiflexion =/> 15 degrees passively ( 10/25/16)    Time 4    Period Weeks   Status On-going     PT LONG TERM GOAL #4   Title improve Rt knee motion -3 degrees extension to 140 degrees flexion ( 10/25/16)    Time 4   Period Weeks   Status New     PT LONG TERM GOAL #5   Title perform squat with good form and good eccentric quad control ( 10/25/16)    Time 4   Period Weeks     PT LONG TERM GOAL #6   Title demo prone quad flexibility to within 3" heel to buttock bilaterally ( 10/25/16)    Time 4   Period Weeks   Status New     PT LONG TERM GOAL #7   Title improve FOTO =/< 30% limited ( 10/25/16)    Time 4   Period Weeks   Status New               Plan - 10/09/16 1722    Clinical Impression Statement Pt. tolerated continued high level hip/knee strengthening activity  well with focus on eccentric quad control activities.  Pt. pain free throughout therex today and reports she consistently gets to most HEP activities in evenings.  Pt. progressing well.   PT Treatment/Interventions Moist Heat;Ultrasound;Therapeutic exercise;Dry needling;Taping;Vasopneumatic Device;Manual techniques;Patient/family education   PT Next Visit Plan progress quad control/strengthening, Rt knee and ankle ROM       Patient will benefit from skilled therapeutic intervention in order to improve the following deficits and impairments:  Increased edema, Decreased strength, Pain, Decreased range of motion, Decreased activity tolerance  Visit Diagnosis: Stiffness of right knee, not elsewhere classified  Muscle weakness (generalized)  Stiffness of right ankle, not elsewhere classified  Stiffness of left ankle, not elsewhere classified     Problem List There are no active problems to display for this patient.   Kermit BaloMicah Suleyman Ehrman, PTA 10/09/16 5:51 PM  [facility] [place] [address]  [address] [place], [place], [address] Phone: [phone]   Fax:  [phone]  Name: Samantha James MRN: [id number] Date of  Birth: [birth year]

## 2016-10-12 ENCOUNTER — Ambulatory Visit: Payer: BC Managed Care – PPO

## 2016-10-12 DIAGNOSIS — M6281 Muscle weakness (generalized): Secondary | ICD-10-CM

## 2016-10-12 DIAGNOSIS — M25661 Stiffness of right knee, not elsewhere classified: Secondary | ICD-10-CM | POA: Diagnosis not present

## 2016-10-12 DIAGNOSIS — M25672 Stiffness of left ankle, not elsewhere classified: Secondary | ICD-10-CM

## 2016-10-12 DIAGNOSIS — M25671 Stiffness of right ankle, not elsewhere classified: Secondary | ICD-10-CM

## 2016-10-12 NOTE — Therapy (Signed)
[facility] [address]  [address] [place], [place], [address] Phone: [phone]   Fax:  [phone]  Physical Therapy Treatment  Patient Details  Name: Samantha James MRN: [id number] Date of Birth: [birth year] Referring Provider: Dr Thurston HoleWainer  Encounter Date: 10/12/2016      PT End of Session - 10/12/16 1453    Visit Number 5   Number of Visits 8   Date for PT Re-Evaluation 10/25/16   PT Start Time 1448   PT Stop Time 1540   PT Time Calculation (min) 52 min   Activity Tolerance Patient limited by pain   Behavior [facility] for tasks assessed/performed      No past medical history on file.  No past surgical history on file.  There were no vitals filed for this visit.      Subjective Assessment - 10/12/16 1452    Subjective Pt. reporting she has been pain free in the R knee since last treatment and is pleased with R knee progress.    Patient Stated Goals run to chase her dog if needed, walk for exercise , squat and lift things and feel safe.    Currently in Pain? No/denies   Multiple Pain Sites No           [facility] Adult PT Treatment/Exercise - 10/12/16 1502      Knee/Hip Exercises: Machines for Strengthening   Cybex Knee Extension 25# x 2 x 10 reps; 2nd set with B conc/R ecc   Cybex Knee Flexion 35# x 2 x 10 reps; 2nd set with B con/ecc   Cybex Leg Press 35# x 10 reps   cues provided for proper technique     Knee/Hip Exercises: Standing   Side Lunges Limitations side stepping 30 feet each direction with green tband at ankles  x 30 ft with monster walk   Step Down 15 reps;Step Height: 6"   Step Down Limitations eccentric lowering   Functional Squat 10 reps  x 15 functional squats   Functional Squat Limitations Alternating lunge x 10 reps each side   Heavy cues required for proper technique   Other Standing Knee Exercises R 6" step up with therapist pulling knee with black looped TB into flexion x 15  reps; focusing on slow descending     Modalities   Modalities Vasopneumatic     Vasopneumatic   Number Minutes Vasopneumatic  10 minutes   Vasopnuematic Location  Knee   Vasopneumatic Pressure Medium   Vasopneumatic Temperature  coldest temp            PT Long Term Goals - 10/12/16 1728      PT LONG TERM GOAL #1   Title I with advanced HEP ( 10/25/16)    Time 4   Period Weeks   Status On-going     PT LONG TERM GOAL #2   Title tolerate short jogging to simulate chasing her dog with no Rt knee pain ( 10/25/16)    Time 4   Period Weeks   Status On-going     PT LONG TERM GOAL #3   Title improve bilat ankle dorsiflexion =/> 15 degrees passively ( 10/25/16)    Time 4   Period Weeks   Status On-going     PT LONG TERM GOAL #4   Title improve Rt knee motion -3 degrees extension to 140 degrees flexion ( 10/25/16)    Time 4   Period Weeks   Status  On-going     PT LONG TERM GOAL #5   Title perform squat with good form and good eccentric quad control ( 10/25/16)    Time 4   Period Weeks   Status On-going     PT LONG TERM GOAL #6   Title demo prone quad flexibility to within 3" heel to buttock bilaterally ( 10/25/16)    Time 4   Period Weeks   Status On-going     PT LONG TERM GOAL #7   Title improve FOTO =/< 30% limited ( 10/25/16)    Time 4   Period Weeks   Status On-going               Plan - 10/12/16 1454    Clinical Impression Statement Pt. able to progress reps and difficulty with all hip/knee strengthening activity today.  Monster walk and side-stepping progressed to green TB and reps increased with all eccentric stepping activity.  Pt. demonstrating improved R quad control and progressing well at this point.   PT Treatment/Interventions Moist Heat;Ultrasound;Therapeutic exercise;Dry needling;Taping;Vasopneumatic Device;Manual techniques;Patient/family education   PT Next Visit Plan Progress quad control/strengthening, Rt knee and ankle ROM       Patient will benefit from skilled therapeutic intervention in order to improve the following deficits and impairments:  Increased edema, Decreased strength, Pain, Decreased range of motion, Decreased activity tolerance  Visit Diagnosis: Stiffness of right knee, not elsewhere classified  Muscle weakness (generalized)  Stiffness of right ankle, not elsewhere classified  Stiffness of left ankle, not elsewhere classified     Problem List There are no active problems to display for this patient.   Kermit BaloMicah Shivaan Tierno, PTA 10/12/16 5:34 PM  [facility] [address]  [address] [place], [place], [address] Phone: [phone]   Fax:  [phone]  Name: Samantha James MRN: [id number] Date of Birth: 1966-03-15

## 2016-10-16 ENCOUNTER — Ambulatory Visit: Payer: BC Managed Care – PPO | Attending: Orthopedic Surgery

## 2016-10-16 DIAGNOSIS — M25672 Stiffness of left ankle, not elsewhere classified: Secondary | ICD-10-CM

## 2016-10-16 DIAGNOSIS — M6281 Muscle weakness (generalized): Secondary | ICD-10-CM | POA: Insufficient documentation

## 2016-10-16 DIAGNOSIS — M25671 Stiffness of right ankle, not elsewhere classified: Secondary | ICD-10-CM | POA: Insufficient documentation

## 2016-10-16 DIAGNOSIS — M25661 Stiffness of right knee, not elsewhere classified: Secondary | ICD-10-CM | POA: Insufficient documentation

## 2016-10-16 NOTE — Therapy (Signed)
[facility] [address]  [place], [place], [address] Phone: [phone]   Fax:  [phone]  Physical Therapy Treatment  Patient Details  Name: Samantha James MRN: [id number] Date of Birth: 12-09-1965 Referring Provider: Dr. Noemi Chapel   Encounter Date: 10/16/2016      PT End of Session - 10/16/16 1712    Visit Number 6   Number of Visits 8   Date for PT Re-Evaluation 10/25/16   PT Start Time 1708  Pt. arrived late   PT Stop Time 1748   PT Time Calculation (min) 40 min   Activity Tolerance Patient limited by pain   Behavior During Therapy [facility] for tasks assessed/performed      No past medical history on file.  No past surgical history on file.  There were no vitals filed for this visit.      Subjective Assessment - 10/16/16 1711    Subjective Pt. reporting R knee is feeling, "the best it has ever felt".     Patient Stated Goals run to chase her dog if needed, walk for exercise , squat and lift things and feel safe.    Currently in Pain? No/denies   Multiple Pain Sites No            [facility] PT Assessment - 10/16/16 1735      Assessment   Medical Diagnosis Rt knee scope, menisectomies   Referring Provider Dr. Noemi Chapel    Onset Date/Surgical Date 08/23/16   Next MD Visit 10/19/16   Prior Therapy none recent     AROM   AROM Assessment Site Knee;Ankle   Right/Left Knee Right   Right Knee Extension 3  3 degrees from neutral    Right Knee Flexion 134   Right/Left Ankle --   Right Ankle Dorsiflexion 13  13 dg dorsiflexion             [facility] Adult PT Treatment/Exercise - 10/16/16 1748      Knee/Hip Exercises: Stretches   Passive Hamstring Stretch Right;30 seconds;1 rep   Sports administrator Right;3 reps;30 seconds   Quad Stretch Limitations with strap   Gastroc Stretch Both;3 reps;30 seconds     Knee/Hip Exercises: Aerobic   Nustep Level 6 x 6 minutes     Knee/Hip Exercises: Machines for  Strengthening   Cybex Knee Extension 35# x 10 reps; B concentric/R eccentric   Cybex Knee Flexion 25# x 10 reps; B concentric/R eccentric      Knee/Hip Exercises: Standing   Side Lunges Limitations side stepping/monster walk 40 feet each direction with green tband at ankles   Step Down 15 reps;Step Height: 6"   Step Down Limitations eccentric lowering   Functional Squat 15 reps   Functional Squat Limitations --  deep squating    Other Standing Knee Exercises --             PT Long Term Goals - 10/16/16 1717      PT LONG TERM GOAL #1   Title I with advanced HEP (10/25/16)    Time 4   Period Weeks   Status On-going     PT LONG TERM GOAL #2   Title tolerate short jogging to simulate chasing her dog with no Rt knee pain ( 10/25/16)    Time 4   Period Weeks   Status Partially Met  10/16/16: Pt. reporting she was able to do light job up driveway without pain.  PT LONG TERM GOAL #3   Title improve bilat ankle dorsiflexion =/> 15 degrees passively ( 10/25/16)    Time 4   Period Weeks   Status On-going  10/16/16: R ankle passive DF 13 dg.       PT LONG TERM GOAL #4   Title improve Rt knee motion -3 degrees extension to 140 degrees flexion ( 10/25/16)    Time 4   Period Weeks   Status On-going  10/16/16: R knee AROM 3-134 dg      PT LONG TERM GOAL #5   Title perform squat with good form and good eccentric quad control ( 10/25/16)    Time 4   Period Weeks   Status Achieved     PT LONG TERM GOAL #6   Title demo prone quad flexibility to within 3" heel to buttock bilaterally ( 10/25/16)    Time 4   Period Weeks   Status On-going  10/16/16: Pt. ~ 4" from buttocks in B quad flex.      PT LONG TERM GOAL #7   Title improve FOTO =/< 30% limited ( 10/25/16)    Time 4   Period Weeks   Status On-going               Plan - 10/16/16 1715    Clinical Impression Statement Pt. progressing well at this point with R knee AROM 3-134 dg today and R DF PROM 13 dg.  Pt.  demonstrating improved quad eccentric stability with symmetrical squat form today.  Pt. R quad flexibility still lacking as compared with L however pt. reporting adherence to quad and PF HEP stretching.  Pt. able to jog short distance in driveway without R knee pain yesterday and seems to be progressing well at this point.     PT Treatment/Interventions Moist Heat;Ultrasound;Therapeutic exercise;Dry needling;Taping;Vasopneumatic Device;Manual techniques;Patient/family education   PT Next Visit Plan Progress quad control/strengthening, Rt knee and ankle ROM      Patient will benefit from skilled therapeutic intervention in order to improve the following deficits and impairments:  Increased edema, Decreased strength, Pain, Decreased range of motion, Decreased activity tolerance  Visit Diagnosis: Stiffness of right knee, not elsewhere classified  Muscle weakness (generalized)  Stiffness of right ankle, not elsewhere classified  Stiffness of left ankle, not elsewhere classified     Problem List There are no active problems to display for this patient.   Bess Harvest, PTA 10/16/16 6:15 PM  [facility] [address]  [place], [place], [address] Phone: [phone]   Fax:  [phone]  Name: Samantha James MRN: [id number] Date of Birth: 04/09/1966

## 2016-10-19 ENCOUNTER — Ambulatory Visit: Payer: BC Managed Care – PPO

## 2016-10-19 ENCOUNTER — Encounter: Payer: BC Managed Care – PPO | Admitting: Physical Therapy

## 2016-10-23 ENCOUNTER — Ambulatory Visit: Payer: BC Managed Care – PPO | Admitting: Physical Therapy

## 2016-10-23 DIAGNOSIS — M25661 Stiffness of right knee, not elsewhere classified: Secondary | ICD-10-CM

## 2016-10-23 DIAGNOSIS — M25671 Stiffness of right ankle, not elsewhere classified: Secondary | ICD-10-CM

## 2016-10-23 DIAGNOSIS — M25672 Stiffness of left ankle, not elsewhere classified: Secondary | ICD-10-CM

## 2016-10-23 DIAGNOSIS — M6281 Muscle weakness (generalized): Secondary | ICD-10-CM

## 2016-10-23 NOTE — Therapy (Signed)
[facility] [address]  [place], [place], [address] Phone: [phone]   Fax:  [phone]  Physical Therapy Treatment  Patient Details  Name: Samantha James MRN: [id number] Date of Birth: 01-12-1966 Referring Provider: Dr. Noemi Chapel   Encounter Date: 10/23/2016      PT End of Session - 10/23/16 1707    Visit Number 7   Number of Visits 12   Date for PT Re-Evaluation 11/10/16   PT Start Time 1703   PT Stop Time 1744   PT Time Calculation (min) 41 min   Activity Tolerance Patient tolerated treatment well   Behavior During Therapy [facility] for tasks assessed/performed      No past medical history on file.  No past surgical history on file.  There were no vitals filed for this visit.      Subjective Assessment - 10/23/16 1706    Subjective Overall knee is doing well - did have some throbbing pain over the weekend - was on her feet a lot   Patient Stated Goals run to chase her dog if needed, walk for exercise , squat and lift things and feel safe.    Currently in Pain? No/denies   Multiple Pain Sites No            [facility] PT Assessment - 10/23/16 0001      AROM   Right/Left Knee Right   Right Knee Extension 3   Right Knee Flexion 134   Right Ankle Dorsiflexion 13                     [facility] Adult PT Treatment/Exercise - 10/23/16 1718      Knee/Hip Exercises: Stretches   Quad Stretch Right;3 reps;30 seconds   Quad Stretch Limitations with strap     Knee/Hip Exercises: Aerobic   Stationary Bike level 3 x 8 minutes     Knee/Hip Exercises: Machines for Strengthening   Cybex Knee Extension 25# x 15 reps; B concentric/R eccentric   Cybex Knee Flexion 25# x 15 reps; B concentric/R eccentric      Knee/Hip Exercises: Standing   Side Lunges Limitations side stepping/monster walk 40 feet each direction with green tband at ankles; forward/backward monster walks x 40 feet green tbans   Step Down  Right;15 reps;Step Height: 8"   Step Down Limitations eccentric lowering   Functional Squat 15 reps   Functional Squat Limitations TRX   SLS R SLS on blue mat 4 x 30 seconds     Knee/Hip Exercises: Supine   Single Leg Bridge Strengthening;Right;10 reps  with R foot on foam roller                     PT Long Term Goals - 10/23/16 1801      PT LONG TERM GOAL #1   Title I with advanced HEP (11/10/16)    Time 4   Period Weeks   Status On-going     PT LONG TERM GOAL #2   Title tolerate short jogging to simulate chasing her dog with no Rt knee pain ( 11/10/16)    Time 4   Period Weeks   Status Partially Met  10/16/16: Pt. reporting she was able to do light job up driveway without pain.       PT LONG TERM GOAL #3   Title improve bilat ankle dorsiflexion =/> 15 degrees passively ( 11/10/16)    Time 4  Period Weeks   Status On-going  10/16/16: R ankle passive DF 13 dg.       PT LONG TERM GOAL #4   Title improve Rt knee motion -3 degrees extension to 140 degrees flexion ( 11/10/16)    Time 4   Period Weeks   Status On-going  10/23/16: R knee AROM 3-134 dg      PT LONG TERM GOAL #5   Title perform squat with good form and good eccentric quad control ( 10/25/16)    Time 4   Period Weeks   Status Achieved     PT LONG TERM GOAL #6   Title demo prone quad flexibility to within 3" heel to buttock bilaterally ( 11/10/16)    Time 4   Period Weeks   Status On-going  10/16/16: Pt. ~ 3.5" from buttocks in B quad flex.      PT LONG TERM GOAL #7   Title improve FOTO =/< 30% limited ( 10/25/16)    Time 4   Period Weeks   Status On-going               Plan - 10/23/16 1803    Clinical Impression Statement Patient has progressed very well as a result of skilled PT intervention, however, patient conintues to lack full R knee extension as well as R knee flexion equal to that of L. Patient also with some visible quad and HS weakness that continues to limit patient  with jogging and overall mobility. Patient to benefit from extension of plan of care for an additional 4 visits to continue to progress R knee/ankle ROM and strength to maximize patients function to allow her to return to PLOF with reduced pain and proper gait/running mechanics.    Rehab Potential Excellent   PT Frequency 2x / week   PT Duration 4 weeks   PT Treatment/Interventions Moist Heat;Ultrasound;Therapeutic exercise;Dry needling;Taping;Vasopneumatic Device;Manual techniques;Patient/family education   PT Next Visit Plan Progress quad control/strengthening, Rt knee and ankle ROM; progress jogging   Consulted and Agree with Plan of Care Patient      Patient will benefit from skilled therapeutic intervention in order to improve the following deficits and impairments:  Increased edema, Decreased strength, Pain, Decreased range of motion, Decreased activity tolerance  Visit Diagnosis: Stiffness of right knee, not elsewhere classified - Plan: PT plan of care cert/re-cert  Muscle weakness (generalized) - Plan: PT plan of care cert/re-cert  Stiffness of right ankle, not elsewhere classified - Plan: PT plan of care cert/re-cert  Stiffness of left ankle, not elsewhere classified - Plan: PT plan of care cert/re-cert     Problem List There are no active problems to display for this patient.      Lanney Gins, PT, DPT 10/23/16 6:14 PM    [facility] [address]  [place], [place], [address] Phone: [phone]   Fax:  [phone]  Name: Samantha James MRN: [id number] Date of Birth: 1966-05-27

## 2016-10-26 ENCOUNTER — Ambulatory Visit: Payer: BC Managed Care – PPO

## 2016-10-26 DIAGNOSIS — M25661 Stiffness of right knee, not elsewhere classified: Secondary | ICD-10-CM | POA: Diagnosis not present

## 2016-10-26 DIAGNOSIS — M25672 Stiffness of left ankle, not elsewhere classified: Secondary | ICD-10-CM

## 2016-10-26 DIAGNOSIS — M25671 Stiffness of right ankle, not elsewhere classified: Secondary | ICD-10-CM

## 2016-10-26 DIAGNOSIS — M6281 Muscle weakness (generalized): Secondary | ICD-10-CM

## 2016-10-26 NOTE — Therapy (Signed)
[facility] [address]  [place], [place], [address] Phone: [phone]   Fax:  [phone]  Physical Therapy Treatment  Patient Details  Name: Samantha James MRN: [id number] Date of Birth: [birth year] Referring Provider: Dr. Noemi Chapel   Encounter Date: 10/26/2016      PT End of Session - 10/26/16 1508    Visit Number 8   Number of Visits 12   Date for PT Re-Evaluation 11/10/16   PT Start Time 5364  pt. arrived late    PT Stop Time 1530   PT Time Calculation (min) 36 min   Activity Tolerance Patient tolerated treatment well   Behavior During Therapy [facility] for tasks assessed/performed      No past medical history on file.  No past surgical history on file.  There were no vitals filed for this visit.      Subjective Assessment - 10/26/16 1454    Subjective Pt. reporting she has stiffness in R knee following prolonged sitting at work at this point however no pain.     Patient Stated Goals run to chase her dog if needed, walk for exercise , squat and lift things and feel safe.    Currently in Pain? No/denies   Multiple Pain Sites No           [facility] Adult PT Treatment/Exercise - 10/26/16 1458      Knee/Hip Exercises: Aerobic   Stationary Bike level 4 x 8 minutes     Knee/Hip Exercises: Machines for Strengthening   Cybex Knee Extension 35# x 10 reps; B con/ecc; 2nd B conc/R ecc   Cybex Knee Flexion 35# x 15 reps; B con/ecc; 10 reps B con/R ecc     Knee/Hip Exercises: Standing   Side Lunges Limitations side stepping/monster walk 30 feet each direction with blue tband at ankles; forward/backward monster walks x 30 feet blue tbans   Functional Squat 15 reps   Functional Squat Limitations TRX + heel raise    SLS R single leg stance with cone touch 2 x 4 x 5 cones    Other Standing Knee Exercises 8" riser side step over x 20 reps              PT Long Term Goals - 10/23/16 1801      PT LONG TERM GOAL  #1   Title I with advanced HEP (11/10/16)    Time 4   Period Weeks   Status On-going     PT LONG TERM GOAL #2   Title tolerate short jogging to simulate chasing her dog with no Rt knee pain ( 11/10/16)    Time 4   Period Weeks   Status Partially Met  10/16/16: Pt. reporting she was able to do light job up driveway without pain.       PT LONG TERM GOAL #3   Title improve bilat ankle dorsiflexion =/> 15 degrees passively ( 11/10/16)    Time 4   Period Weeks   Status On-going  10/16/16: R ankle passive DF 13 dg.       PT LONG TERM GOAL #4   Title improve Rt knee motion -3 degrees extension to 140 degrees flexion ( 11/10/16)    Time 4   Period Weeks   Status On-going  10/23/16: R knee AROM 3-134 dg      PT LONG TERM GOAL #5   Title perform squat with good form and good eccentric quad control (  10/25/16)    Time 4   Period Weeks   Status Achieved     PT LONG TERM GOAL #6   Title demo prone quad flexibility to within 3" heel to buttock bilaterally ( 11/10/16)    Time 4   Period Weeks   Status On-going  10/16/16: Pt. ~ 3.5" from buttocks in B quad flex.      PT LONG TERM GOAL #7   Title improve FOTO =/< 30% limited ( 10/25/16)    Time 4   Period Weeks   Status On-going               Plan - 10/26/16 1525    Clinical Impression Statement Pt. tolerated progression in all hip/knee strengthening activity well today with only mild R knee pain reported which self-resolved quickly.  Sidestepping, monster walk progressed to blue TB today and weight with machine LE strengthening progressed without issue.  Will plan to attempt light jogging next treatment per tolerance.  Pt. progressing well at this point.     PT Treatment/Interventions Moist Heat;Ultrasound;Therapeutic exercise;Dry needling;Taping;Vasopneumatic Device;Manual techniques;Patient/family education   PT Next Visit Plan Progress jogging; Progress quad control/strengthening, Rt knee and ankle ROM      Patient will  benefit from skilled therapeutic intervention in order to improve the following deficits and impairments:  Increased edema, Decreased strength, Pain, Decreased range of motion, Decreased activity tolerance  Visit Diagnosis: Stiffness of right knee, not elsewhere classified  Muscle weakness (generalized)  Stiffness of right ankle, not elsewhere classified  Stiffness of left ankle, not elsewhere classified     Problem List There are no active problems to display for this patient.   Bess Harvest, PTA 10/26/16 5:53 PM  [facility] [address]  [place], [place], [address] Phone: [phone]   Fax:  [phone]  Name: CYNTHIS PURINGTON MRN: [id number] Date of Birth: 10-19-1966

## 2016-10-31 ENCOUNTER — Ambulatory Visit: Payer: BC Managed Care – PPO

## 2016-10-31 DIAGNOSIS — M6281 Muscle weakness (generalized): Secondary | ICD-10-CM

## 2016-10-31 DIAGNOSIS — M25671 Stiffness of right ankle, not elsewhere classified: Secondary | ICD-10-CM

## 2016-10-31 DIAGNOSIS — M25661 Stiffness of right knee, not elsewhere classified: Secondary | ICD-10-CM | POA: Diagnosis not present

## 2016-10-31 DIAGNOSIS — M25672 Stiffness of left ankle, not elsewhere classified: Secondary | ICD-10-CM

## 2016-10-31 NOTE — Therapy (Signed)
[facility] [address]  [place], [place], [address] Phone: [phone]   Fax:  [phone]  Physical Therapy Treatment  Patient Details  Name: Samantha James MRN: [id number] Date of Birth: 03/21/1966 Referring Provider: Dr. Noemi Chapel   Encounter Date: 10/31/2016      PT End of Session - 10/31/16 1703    Visit Number 9   Number of Visits 12   Date for PT Re-Evaluation 11/10/16   PT Start Time 1702   PT Stop Time 1745   PT Time Calculation (min) 43 min   Activity Tolerance Patient tolerated treatment well   Behavior During Therapy [facility] for tasks assessed/performed      No past medical history on file.  No past surgical history on file.  There were no vitals filed for this visit.      Subjective Assessment - 10/31/16 1701    Subjective Pt. reporting R knee is feeling good today.     Patient Stated Goals run to chase her dog if needed, walk for exercise , squat and lift things and feel safe.    Currently in Pain? No/denies   Multiple Pain Sites No            [facility] PT Assessment - 10/31/16 1708      Assessment   Medical Diagnosis Rt knee scope, menisectomies   Referring Provider Dr. Noemi Chapel    Onset Date/Surgical Date 08/23/16   Next MD Visit ~ 11/19/16   Prior Therapy none recent              [facility] Adult PT Treatment/Exercise - 10/31/16 1705      Knee/Hip Exercises: Stretches   Quad Stretch Right;3 reps;30 seconds   Quad Stretch Limitations with strap and bolster under thigh to enhance stretch     Knee/Hip Exercises: Aerobic   Stationary Bike level 5 x 6 minutes   Tread Mill Treadmill: 3 min; 2.0lvl (30 sec), 3.0 (30 sec), 4.0 (30 sec), 4.5 (90 sec)     Knee/Hip Exercises: Standing   Side Lunges Both   Side Lunges Limitations side stepping/monster walk 60 feet each direction with blue tband at ankles; forward/backward monster walks x 60 feet blue tbans   Forward Step Up 1 set;Step Height:  8";15 reps   Forward Step Up Limitations with black TB pulling into flexion with therapist resistance   Other Standing Knee Exercises forward step up on 8" riser with 6# dumbbells and alternating hip flexion x 15 reps; emphasis on eccentric control with step back    Other Standing Knee Exercises R step up onto foam oval on 10" stool with alternating SLR hip abduction, flexion, adduction x 10 reps each with green TB around L             PT Long Term Goals - 10/31/16 1724      PT LONG TERM GOAL #1   Title I with advanced HEP (11/10/16)    Time 4   Period Weeks   Status On-going     PT LONG TERM GOAL #2   Title tolerate short jogging to simulate chasing her dog with no Rt knee pain ( 11/10/16)    Time 4   Period Weeks   Status Partially Met  10/16/16: Pt. reporting she was able to do light job up driveway without pain.       PT LONG TERM GOAL #3   Title improve bilat ankle dorsiflexion =/> 15 degrees passively (  11/10/16)    Time 4   Period Weeks   Status On-going  10/16/16: R ankle passive DF 13 dg.       PT LONG TERM GOAL #4   Title improve Rt knee motion -3 degrees extension to 140 degrees flexion ( 11/10/16)    Time 4   Period Weeks   Status On-going  10/23/16: R knee AROM 3-134 dg      PT LONG TERM GOAL #5   Title perform squat with good form and good eccentric quad control ( 10/25/16)    Time 4   Period Weeks   Status Achieved     PT LONG TERM GOAL #6   Title demo prone quad flexibility to within 3" heel to buttock bilaterally ( 11/10/16)    Time 4   Period Weeks   Status On-going  10/16/16: Pt. ~ 3.5" from buttocks in B quad flex.      PT LONG TERM GOAL #7   Title improve FOTO =/< 30% limited ( 10/25/16)    Time 4   Period Weeks   Status On-going               Plan - 10/31/16 1706    Clinical Impression Statement  Pt. able to perform short jog on treadmill at 4.5 mph with only mild discomfort at R knee today.  Standing R hip/knee strengthening  progressed to incorporate more proprioception today. Pt. tolerated all activities well today however reporting mild discomfort in R knee following treatment.  Pt. reporting she will ice R knee at home.   PT Treatment/Interventions Moist Heat;Ultrasound;Therapeutic exercise;Dry needling;Taping;Vasopneumatic Device;Manual techniques;Patient/family education   PT Next Visit Plan 10th VISIT FOTO; Progress jogging; Progress quad control/strengthening, Rt knee and ankle ROM      Patient will benefit from skilled therapeutic intervention in order to improve the following deficits and impairments:  Increased edema, Decreased strength, Pain, Decreased range of motion, Decreased activity tolerance  Visit Diagnosis: Stiffness of right knee, not elsewhere classified  Muscle weakness (generalized)  Stiffness of right ankle, not elsewhere classified  Stiffness of left ankle, not elsewhere classified     Problem List There are no active problems to display for this patient.   Bess Harvest, PTA 10/31/16 6:01 PM  [facility] [address]  [place], [place], [address] Phone: [phone]   Fax:  [phone]  Name: Samantha James MRN: [id number] Date of Birth: Jan 19, 1966

## 2016-11-02 ENCOUNTER — Ambulatory Visit: Payer: BC Managed Care – PPO

## 2016-11-02 DIAGNOSIS — M25661 Stiffness of right knee, not elsewhere classified: Secondary | ICD-10-CM

## 2016-11-02 DIAGNOSIS — M6281 Muscle weakness (generalized): Secondary | ICD-10-CM

## 2016-11-02 DIAGNOSIS — M25672 Stiffness of left ankle, not elsewhere classified: Secondary | ICD-10-CM

## 2016-11-02 DIAGNOSIS — M25671 Stiffness of right ankle, not elsewhere classified: Secondary | ICD-10-CM

## 2016-11-02 NOTE — Therapy (Signed)
[facility] [address]  [place], [place], [address] Phone: [phone]   Fax:  [phone]  Physical Therapy Treatment  Patient Details  Name: Samantha James MRN: [id number] Date of Birth: 1966-04-19 Referring Provider: Dr. Noemi Chapel   Encounter Date: 11/02/2016      PT End of Session - 11/02/16 1451    Visit Number 10   Number of Visits 12   Date for PT Re-Evaluation 11/10/16   PT Start Time 6759   PT Stop Time 1527   PT Time Calculation (min) 40 min   Activity Tolerance Patient tolerated treatment well   Behavior During Therapy [facility] for tasks assessed/performed      No past medical history on file.  No past surgical history on file.  There were no vitals filed for this visit.      Subjective Assessment - 11/02/16 1450    Subjective Pt. reporting mild R knee pain today.   Patient Stated Goals run to chase her dog if needed, walk for exercise , squat and lift things and feel safe.    Currently in Pain? No/denies   Multiple Pain Sites No            [facility] PT Assessment - 11/02/16 1517      Observation/Other Assessments   Focus on Therapeutic Outcomes (FOTO)  66 % status (34% limitation)           [facility] Adult PT Treatment/Exercise - 11/02/16 1458      Knee/Hip Exercises: Stretches   Quad Stretch Right;3 reps;30 seconds   Quad Stretch Limitations with strap and bolster under thigh to enhance stretch   Gastroc Stretch Both;30 seconds;1 rep   Press photographer Limitations wiht therapist      Knee/Hip Exercises: Aerobic   Stationary Bike level 5 x 6 minutes   Tread Mill Treadmill: 3 min; 2.5lvl (30 sec), 3.5 (30 sec), 4.0 (30 sec), 4.5 (90 sec)     Knee/Hip Exercises: Standing   Side Lunges Both   Side Lunges Limitations side stepping/monster walk 60 feet each direction with blue tband at ankles; forward/backward monster walks x 60 feet blue tbans   Forward Step Up 1 set;Step Height: 8";15 reps    Forward Step Up Limitations with black TB pulling into flexion with therapist resistance   Step Down Right;15 reps;Step Height: 8"   Step Down Limitations eccentric lowering   Functional Squat 15 reps  wide base    Functional Squat Limitations TRX + heel raise      Manual Therapy   Manual Therapy Soft tissue mobilization   Manual therapy comments Strumming to R RF in mod thomas position) x 2 min             PT Long Term Goals - 10/31/16 1724      PT LONG TERM GOAL #1   Title I with advanced HEP (11/10/16)    Time 4   Period Weeks   Status On-going     PT LONG TERM GOAL #2   Title tolerate short jogging to simulate chasing her dog with no Rt knee pain ( 11/10/16)    Time 4   Period Weeks   Status Partially Met  10/16/16: Pt. reporting she was able to do light job up driveway without pain.       PT LONG TERM GOAL #3   Title improve bilat ankle dorsiflexion =/> 15 degrees passively ( 11/10/16)    Time 4  Period Weeks   Status On-going  10/16/16: R ankle passive DF 13 dg.       PT LONG TERM GOAL #4   Title improve Rt knee motion -3 degrees extension to 140 degrees flexion ( 11/10/16)    Time 4   Period Weeks   Status On-going  10/23/16: R knee AROM 3-134 dg      PT LONG TERM GOAL #5   Title perform squat with good form and good eccentric quad control ( 10/25/16)    Time 4   Period Weeks   Status Achieved     PT LONG TERM GOAL #6   Title demo prone quad flexibility to within 3" heel to buttock bilaterally ( 11/10/16)    Time 4   Period Weeks   Status On-going  10/16/16: Pt. ~ 3.5" from buttocks in B quad flex.      PT LONG TERM GOAL #7   Title improve FOTO =/< 30% limited ( 10/25/16)    Time 4   Period Weeks   Status On-going               Plan - 11/02/16 1452    Clinical Impression Statement Pt. performed well today with mild increase in pace with jogging on treadmill.  Pt. pain free with treatment today reporting knee seems to be doing much  better.  Pt. at 8" height with all stepping activities and continued focus today with RF/quad stretching in mod [place] position.  Pt. quad flexibility seems to be improving.  Pt. progressing well at this point.    PT Treatment/Interventions Moist Heat;Ultrasound;Therapeutic exercise;Dry needling;Taping;Vasopneumatic Device;Manual techniques;Patient/family education   PT Next Visit Plan Progress jogging; Progress quad control/strengthening, Rt knee and ankle ROM      Patient will benefit from skilled therapeutic intervention in order to improve the following deficits and impairments:  Increased edema, Decreased strength, Pain, Decreased range of motion, Decreased activity tolerance  Visit Diagnosis: Stiffness of right knee, not elsewhere classified  Muscle weakness (generalized)  Stiffness of right ankle, not elsewhere classified  Stiffness of left ankle, not elsewhere classified     Problem List There are no active problems to display for this patient.   Bess Harvest, PTA 11/02/16 5:42 PM  [facility] [address]  [place], [place], [address] Phone: [phone]   Fax:  [phone]  Name: Samantha James MRN: [id number] Date of Birth: Mar 29, 1966

## 2016-11-07 ENCOUNTER — Ambulatory Visit: Payer: BC Managed Care – PPO | Admitting: Physical Therapy

## 2016-11-07 DIAGNOSIS — M25661 Stiffness of right knee, not elsewhere classified: Secondary | ICD-10-CM | POA: Diagnosis not present

## 2016-11-07 DIAGNOSIS — M25671 Stiffness of right ankle, not elsewhere classified: Secondary | ICD-10-CM

## 2016-11-07 DIAGNOSIS — M6281 Muscle weakness (generalized): Secondary | ICD-10-CM

## 2016-11-07 DIAGNOSIS — M25672 Stiffness of left ankle, not elsewhere classified: Secondary | ICD-10-CM

## 2016-11-07 NOTE — Therapy (Signed)
[facility] [address]  [place], [place], [address] Phone: [phone]   Fax:  [phone]  Physical Therapy Treatment  Patient Details  Name: Samantha James MRN: [id number] Date of Birth: [birth year] Referring Provider: Dr. Noemi Chapel   Encounter Date: 11/07/2016      PT End of Session - 11/07/16 1107    Visit Number 11   Number of Visits 12   Date for PT Re-Evaluation 11/10/16   PT Start Time 1103   PT Stop Time 1148   PT Time Calculation (min) 45 min   Activity Tolerance Patient tolerated treatment well   Behavior During Therapy [facility] for tasks assessed/performed      No past medical history on file.  No past surgical history on file.  There were no vitals filed for this visit.      Subjective Assessment - 11/07/16 1110    Subjective No knee pain over weekend - feels a little stiff today due to little activity over the weekend   Patient Stated Goals run to chase her dog if needed, walk for exercise , squat and lift things and feel safe.    Currently in Pain? No/denies   Multiple Pain Sites No                         [facility] Adult PT Treatment/Exercise - 11/07/16 1112      Knee/Hip Exercises: Stretches   Quad Stretch Right;3 reps;30 seconds   Quad Stretch Limitations pt prone - bolster under thigh - use of strap     Knee/Hip Exercises: Aerobic   Stationary Bike level 4 x 8 minutes     Knee/Hip Exercises: Machines for Strengthening   Cybex Knee Extension 35# - 2 up/1 down for eccentric control x 2 sets x 15 reps   Cybex Knee Flexion 35# - 2 down/1 up for eccentric control x 15 reps     Knee/Hip Exercises: Standing   Side Lunges Limitations side stepping 30 feet x 2 each direction with blue tband at ankles; fwd/bwd monster walk - blue tband 30 feet x 2 each with blue tband at ankles   Functional Squat 15 reps   Functional Squat Limitations TRX + heel raise    SLS R SL stance on blue  foam - L LE reach for 3 cones (anterior, lateral, posterior) x 15 reps   Other Standing Knee Exercises ladder drills  - no pain     Knee/Hip Exercises: Supine   Other Supine Knee/Hip Exercises straight leg bridge with hamstring curl on peanut ball x 10 reps                     PT Long Term Goals - 10/31/16 1724      PT LONG TERM GOAL #1   Title I with advanced HEP (11/10/16)    Time 4   Period Weeks   Status On-going     PT LONG TERM GOAL #2   Title tolerate short jogging to simulate chasing her dog with no Rt knee pain ( 11/10/16)    Time 4   Period Weeks   Status Partially Met  10/16/16: Pt. reporting she was able to do light job up driveway without pain.       PT LONG TERM GOAL #3   Title improve bilat ankle dorsiflexion =/> 15 degrees passively ( 11/10/16)    Time 4   Period Weeks  Status On-going  10/16/16: R ankle passive DF 13 dg.       PT LONG TERM GOAL #4   Title improve Rt knee motion -3 degrees extension to 140 degrees flexion ( 11/10/16)    Time 4   Period Weeks   Status On-going  10/23/16: R knee AROM 3-134 dg      PT LONG TERM GOAL #5   Title perform squat with good form and good eccentric quad control ( 10/25/16)    Time 4   Period Weeks   Status Achieved     PT LONG TERM GOAL #6   Title demo prone quad flexibility to within 3" heel to buttock bilaterally ( 11/10/16)    Time 4   Period Weeks   Status On-going  10/16/16: Pt. ~ 3.5" from buttocks in B quad flex.      PT LONG TERM GOAL #7   Title improve FOTO =/< 30% limited ( 10/25/16)    Time 4   Period Weeks   Status On-going               Plan - 11/07/16 1112    Clinical Impression Statement Patient doing well today with all stretching/strengthening tasks. Patient making good progress towards all goals with no pain during any activity. Ladder drills initiated today with low level plyometrics with no pain reproduced - only mild SOB due to reduced aerobic endurance. Patient  with last treatment scheduled planned for this Thursday. Will plan to review comprehensive HEP and prepare patient to continue to maintain and progress functional progress.    PT Treatment/Interventions Moist Heat;Ultrasound;Therapeutic exercise;Dry needling;Taping;Vasopneumatic Device;Manual techniques;Patient/family education   PT Next Visit Plan Progress jogging; Progress quad control/strengthening, Rt knee and ankle ROM   Consulted and Agree with Plan of Care Patient      Patient will benefit from skilled therapeutic intervention in order to improve the following deficits and impairments:  Increased edema, Decreased strength, Pain, Decreased range of motion, Decreased activity tolerance  Visit Diagnosis: Stiffness of right knee, not elsewhere classified  Muscle weakness (generalized)  Stiffness of right ankle, not elsewhere classified  Stiffness of left ankle, not elsewhere classified     Problem List There are no active problems to display for this patient.    Lanney Gins, PT, DPT 11/07/16 11:57 AM    [facility] [address]  [place], [place], [address] Phone: [phone]   Fax:  [phone]  Name: Samantha James MRN: [id number] Date of Birth: 06-11-1966

## 2016-11-09 ENCOUNTER — Ambulatory Visit: Payer: BC Managed Care – PPO | Admitting: Physical Therapy

## 2016-11-09 DIAGNOSIS — M25661 Stiffness of right knee, not elsewhere classified: Secondary | ICD-10-CM | POA: Diagnosis not present

## 2016-11-09 DIAGNOSIS — M25671 Stiffness of right ankle, not elsewhere classified: Secondary | ICD-10-CM

## 2016-11-09 DIAGNOSIS — M25672 Stiffness of left ankle, not elsewhere classified: Secondary | ICD-10-CM

## 2016-11-09 DIAGNOSIS — M6281 Muscle weakness (generalized): Secondary | ICD-10-CM

## 2016-11-09 NOTE — Therapy (Signed)
[facility] [address]  [place], [place], [address] Phone: [phone]   Fax:  [phone]  Physical Therapy Treatment  Patient Details  Name: Samantha James MRN: [id number] Date of Birth: 1966/07/28 Referring Provider: Dr. Noemi Chapel   Encounter Date: 11/09/2016      PT End of Session - 11/09/16 1111    Visit Number 12   Number of Visits 12   Date for PT Re-Evaluation 11/10/16   PT Start Time 1103   PT Stop Time 1147   PT Time Calculation (min) 44 min   Activity Tolerance Patient tolerated treatment well   Behavior During Therapy [facility] for tasks assessed/performed      No past medical history on file.  No past surgical history on file.  There were no vitals filed for this visit.      Subjective Assessment - 11/09/16 1104    Subjective Knee is feeling well - no complaints   Patient Stated Goals run to chase her dog if needed, walk for exercise , squat and lift things and feel safe.    Currently in Pain? No/denies   Multiple Pain Sites No            [facility] PT Assessment - 11/09/16 0001      Observation/Other Assessments   Focus on Therapeutic Outcomes (FOTO)  Knee: 72 (28% limited)     AROM   Right/Left Knee Right   Right Knee Extension 2   Right Knee Flexion 136   Right/Left Ankle Right;Left   Right Ankle Dorsiflexion 15   Left Ankle Dorsiflexion 15                     [facility] Adult PT Treatment/Exercise - 11/09/16 1119      Knee/Hip Exercises: Stretches   Quad Stretch Right;3 reps;30 seconds     Knee/Hip Exercises: Aerobic   Stationary Bike level 4 x 8 minutes     Knee/Hip Exercises: Machines for Strengthening   Cybex Knee Extension 35# - 2 up/1 down for eccentric control x 15 reps   Cybex Knee Flexion 35# - 2 down/1 up for eccentric control x 15 reps     Knee/Hip Exercises: Standing   Side Lunges Limitations side stepping 30 feet each direction with blue tband at ankles;  monstrer walk forward/backward 30 feet   Functional Squat 15 reps   Functional Squat Limitations TRX + heel raise    SLS R SL stance on blue foam - L LE reach for 4 cones (anterior, lateral, posterior) x 15 reps                     PT Long Term Goals - 11/09/16 1402      PT LONG TERM GOAL #1   Title I with advanced HEP (11/10/16)    Status Achieved     PT LONG TERM GOAL #2   Title tolerate short jogging to simulate chasing her dog with no Rt knee pain ( 11/10/16)    Status Achieved     PT LONG TERM GOAL #3   Title improve bilat ankle dorsiflexion =/> 15 degrees passively ( 11/10/16)    Status Achieved     PT LONG TERM GOAL #4   Title improve Rt knee motion -3 degrees extension to 140 degrees flexion ( 11/10/16)    Status Partially Met  2 degrees from neutral     PT LONG TERM GOAL #5  Title perform squat with good form and good eccentric quad control ( 10/25/16)    Status Achieved     PT LONG TERM GOAL #6   Title demo prone quad flexibility to within 3" heel to buttock bilaterally ( 11/10/16)    Status Achieved  B - 3"     PT LONG TERM GOAL #7   Title improve FOTO =/< 30% limited ( 10/25/16)    Status Achieved  Knee: 72 (28% limited)               Plan - 11/09/16 1403    Clinical Impression Statement Patient has progressed very well as a result of skilled PT intervention with patient improving overall strength and flexibility. Patient able to demonstrate good squat mechanics, good eccentric quad control as well as improved flexibility of both R quadriceps and hamstring musculature, however, patient continues to have slight deficit with full R knee extension with patient measuring 2 degrees from neutral. Patient with subjective reports of ability to perform light jogging activity with no pain. PT and patient agreeing upon discharge on this date as patient has met or partially met all established goals and is currently capable of being able to perform  comprehensive HEP independently to progress and maintain functional status.    Rehab Potential Excellent   PT Frequency 2x / week   PT Duration 4 weeks   PT Treatment/Interventions Moist Heat;Ultrasound;Therapeutic exercise;Dry needling;Taping;Vasopneumatic Device;Manual techniques;Patient/family education   PT Next Visit Plan Discharge on this date   Consulted and Agree with Plan of Care Patient      Patient will benefit from skilled therapeutic intervention in order to improve the following deficits and impairments:  Increased edema, Decreased strength, Pain, Decreased range of motion, Decreased activity tolerance  Visit Diagnosis: Stiffness of right knee, not elsewhere classified  Muscle weakness (generalized)  Stiffness of right ankle, not elsewhere classified  Stiffness of left ankle, not elsewhere classified     Problem List There are no active problems to display for this patient.    Lanney Gins, PT, DPT 11/09/16 2:10 PM  PHYSICAL THERAPY DISCHARGE SUMMARY  Visits from Start of Care: 12  Current functional level related to goals / functional outcomes: See above; good eccentric quad control, ability to perform light jogging with no pain    Remaining deficits: See above; R knee extension 2 degrees from neutral    Education / Equipment: HEP  Plan: Patient agrees to discharge.  Patient goals were met. Patient is being discharged due to meeting the stated rehab goals.  ?????     Lanney Gins, PT, DPT 11/09/16 2:11 PM  [facility] [address]  [place], [place], [address] Phone: [phone]   Fax:  [phone]  Name: Samantha James MRN: [id number] Date of Birth: 08/26/1966

## 2020-03-03 ENCOUNTER — Encounter: Payer: Self-pay | Admitting: Neurology

## 2020-05-11 NOTE — Progress Notes (Signed)
NEUROLOGY CONSULTATION NOTE  Samantha James MRN: [id number] DOB: 1966-02-25  Referring provider: Juluis Rainier, MD Primary care provider: Juluis Rainier, MD  Reason for consult:  Numbness and tingling  HISTORY OF PRESENT ILLNESS: Samantha James is a 54 year old right-handed female who presents for left upper extremity numbness and tingling.  History supplemented by referring provider note.  In January, she was performing heavy lifting during house cleaning and pulled her neck, causing sharp left sided neck pain.  She was given a muscle relaxant and pain resolved.  She received her first COVID-19 vaccine [employer]) on 01/09/2020.  When the needle went into her arm, she felt a burning pain running down the arm.  Afterwards, she developed intermittent episodes of numbness and tingling radiating down the lateral aspect of her left arm to the entire hand and down the lateral left leg to bottom of her foot.  It would last only a few seconds and not positional.  Sometimes it involves only the arm or only the leg.  No associated pain or focal weakness.  However, for a while now, she reports pain along and around the base of her thumb of both hands which causes difficulty opening jars.  She had her second COVID-19 vaccine in April in her right arm without incident.  She does wear a wrist splint such as at night or sometimes at work, which helps.  03/02/2020 LABS:  Hgb A1c 7.6, TSH 1.62, B12 638.  PAST MEDICAL HISTORY: Past Medical History:  Diagnosis Date  . Diabetes mellitus without complication (HCC)   . Hyperglycemia   . Hypertension     PAST SURGICAL HISTORY: Past Surgical History:  Procedure Laterality Date  . CESAREAN SECTION  2006  . CESAREAN SECTION  2001    MEDICATIONS: Outpatient Encounter Medications as of 05/13/2020  Medication Sig  . glimepiride (AMARYL) 1 MG tablet Take 2 mg by mouth every morning.  . lovastatin (MEVACOR) 20 MG tablet Take 20 mg by mouth every morning.   .  metFORMIN (GLUCOPHAGE) 500 MG tablet Take by mouth 2 (two) times daily with a meal.  . Multiple Vitamins-Minerals (MULTIVITAL PO) Take by mouth every morning.   No facility-administered encounter medications on file as of 05/13/2020.    ALLERGIES: Allergies  Allergen Reactions  . Penicillins     Rash all over    FAMILY HISTORY: Family History  Problem Relation Age of Onset  . Congestive Heart Failure Father     SOCIAL HISTORY: Social History   Socioeconomic History  . Marital status: Married    Spouse name: Not on file  . Number of children: Not on file  . Years of education: Not on file  . Highest education level: Not on file  Occupational History  . Not on file  Tobacco Use  . Smoking status: Never Smoker  . Smokeless tobacco: Never Used  Substance and Sexual Activity  . Alcohol use: Not on file  . Drug use: Not on file  . Sexual activity: Not on file  Other Topics Concern  . Not on file  Social History Narrative  . Not on file   Social Determinants of Health   Financial Resource Strain:   . Difficulty of Paying Living Expenses:   Food Insecurity:   . Worried About Programme researcher, broadcasting/film/video in the Last Year:   . Barista in the Last Year:   Transportation Needs:   . Freight forwarder (Medical):   Marland Kitchen Lack  of Transportation (Non-Medical):   Physical Activity:   . Days of Exercise per Week:   . Minutes of Exercise per Session:   Stress:   . Feeling of Stress :   Social Connections:   . Frequency of Communication with Friends and Family:   . Frequency of Social Gatherings with Friends and Family:   . Attends Religious Services:   . Active Member of Clubs or Organizations:   . Attends Banker Meetings:   Marland Kitchen Marital Status:   Intimate Partner Violence:   . Fear of Current or Ex-Partner:   . Emotionally Abused:   Marland Kitchen Physically Abused:   . Sexually Abused:     PHYSICAL EXAM: Blood pressure (!) 165/92, height 5\' 3"  (1.6 m), weight 156 lb  12.8 oz (71.1 kg). General: No acute distress.  Patient appears well-groomed.   Head:  Normocephalic/atraumatic Eyes:  fundi examined but not visualized Neck: supple, no paraspinal tenderness, full range of motion Back: No paraspinal tenderness Heart: regular rate and rhythm Lungs: Clear to auscultation bilaterally. Vascular: No carotid bruits. Neurological Exam: Mental status: alert and oriented to person, place, and time, recent and remote memory intact, fund of knowledge intact, attention and concentration intact, speech fluent and not dysarthric, language intact. Cranial nerves: CN I: not tested CN II: pupils equal, round and reactive to light, visual fields intact CN III, IV, VI:  full range of motion, no nystagmus, no ptosis CN V: facial sensation intact CN VII: upper and lower face symmetric CN VIII: hearing intact CN IX, X: gag intact, uvula midline CN XI: sternocleidomastoid and trapezius muscles intact CN XII: tongue midline Bulk & Tone: normal, no fasciculations. Motor:  5/5 throughout  Sensation:  Pinprick and vibration sensation intact. Deep Tendon Reflexes:  2+ throughout, toes downgoing.   Finger to nose testing:  Without dysmetria.   Heel to shin:  Without dysmetria.   Gait:  Normal station and stride.  Able to turn and tandem walk. Romberg negative. Tinel's sign:  Negative Phalen's sign:  Positive  IMPRESSION: Intermittent left arm numbness Intermittent left leg numbness Likely two different etiologies.  Upper extremity may be cervical radiculopathy or even carpal tunnel syndrome (symptoms improved with wrist splint).  Lower extremity symptoms may be lumbosacral radiculopathy.  Neurologic exam unremarkable with no lateralizing symptoms or upper motor neuron symptoms to suggest intracranial abnormality or involvement of spinal cord.  Pain in hands may be related to arthritis or possibly de Quervain's tenosynovitis.  PLAN: 1.  NCV-EMG left upper and lower  extremities 2.  Continue use of wrist splint 3.  Further recommendations pending results. 4.  Follow up blood pressure with PCP  Thank you for allowing me to take part in the care of this patient.  , DO  CC: Shon Millet, MD

## 2020-05-13 ENCOUNTER — Encounter: Payer: Self-pay | Admitting: Neurology

## 2020-05-13 ENCOUNTER — Ambulatory Visit: Payer: BC Managed Care – PPO | Admitting: Neurology

## 2020-05-13 ENCOUNTER — Other Ambulatory Visit: Payer: Self-pay

## 2020-05-13 VITALS — BP 165/92 | Ht 63.0 in | Wt 156.8 lb

## 2020-05-13 DIAGNOSIS — R030 Elevated blood-pressure reading, without diagnosis of hypertension: Secondary | ICD-10-CM | POA: Diagnosis not present

## 2020-05-13 DIAGNOSIS — R200 Anesthesia of skin: Secondary | ICD-10-CM | POA: Diagnosis not present

## 2020-05-13 DIAGNOSIS — R202 Paresthesia of skin: Secondary | ICD-10-CM | POA: Diagnosis not present

## 2020-05-13 NOTE — Patient Instructions (Signed)
1.  We will check nerve conduction study of left arm and leg.  Further recommendations pending results 2.  In meantime, wear wrist splint.

## 2020-07-06 ENCOUNTER — Encounter: Payer: BC Managed Care – PPO | Admitting: Neurology

## 2020-07-27 ENCOUNTER — Other Ambulatory Visit: Payer: Self-pay

## 2020-07-27 ENCOUNTER — Ambulatory Visit (INDEPENDENT_AMBULATORY_CARE_PROVIDER_SITE_OTHER): Payer: BC Managed Care – PPO | Admitting: Neurology

## 2020-07-27 DIAGNOSIS — R200 Anesthesia of skin: Secondary | ICD-10-CM

## 2020-07-27 DIAGNOSIS — R202 Paresthesia of skin: Secondary | ICD-10-CM

## 2020-07-27 NOTE — Procedures (Signed)
[facility]  [address] [place], [address]  [place], [place] [address] Tel: [phone] Fax:  [phone] Test Date:  07/27/2020  Patient: Samantha James DOB: [birth year] Physician: Nita Sickle, DO  Sex: Female Height: 5\' 3"  Ref Phys: , D.O.  ID#: Shon Millet Temp: 33.0C Technician:    Patient Complaints: This is a 54 year old female referred for evaluation of numbness and tingling involving the left arm and leg.  NCV & EMG Findings: Extensive electrodiagnostic testing of the left upper and lower extremity shows: 1. All sensory responses including the left median, ulnar, mixed palmar, sural, and superficial peroneal nerves are within normal limits. 2. All motor responses including the left median, ulnar, peroneal, and tibial nerves are within normal limits. 3. Left tibial H reflex study is within normal limits. 4. There is no evidence of active or chronic motor axonal loss changes affecting any of the tested muscles.  Motor unit configuration and recruitment pattern is within normal limits.  Impression: This is a normal study of the left upper and lower extremities.  In particular, there is no evidence of a sensorimotor polyneuropathy, cervical/lumbosacral radiculopathy, carpal tunnel syndrome.   ___________________________ 57, DO    Nerve Conduction Studies Anti Sensory Summary Table   Stim Site NR Peak (ms) Norm Peak (ms) P-T Amp (V) Norm P-T Amp  Left Median Anti Sensory (2nd Digit)  33C  Wrist    3.1 <3.6 56.2 >15  Left Sup Peroneal Anti Sensory (Ant Lat Mall)  33C  12 cm    2.3 <4.6 14.2 >4  Left Sural Anti Sensory (Lat Mall)  33C  Calf    3.4 <4.6 23.1 >4  Left Ulnar Anti Sensory (5th Digit)  33C  Wrist    3.0 <3.1 53.8 >10   Motor Summary Table   Stim Site NR Onset (ms) Norm Onset (ms) O-P Amp (mV) Norm O-P Amp Site1 Site2 Delta-0 (ms) Dist (cm) Vel (m/s) Norm Vel (m/s)  Left Median Motor (Abd Poll Brev)  33C  Wrist    3.0 <4.0 10.0  >6 Elbow Wrist 4.7 28.0 60 >50  Elbow    7.7  9.7         Left Peroneal Motor (Ext Dig Brev)  33C  Ankle    3.7 <6.0 5.6 >2.5 B Fib Ankle 6.4 33.0 52 >40  B Fib    10.1  5.3  Poplt B Fib 1.4 8.0 57 >40  Poplt    11.5  5.1         Left Tibial Motor (Abd Hall Brev)  33C  Ankle    3.4 <6.0 12.9 >4 Knee Ankle 8.1 37.0 46 >40  Knee    11.5  10.9         Left Ulnar Motor (Abd Dig Minimi)  33C  Wrist    2.3 <3.1 9.2 >7 B Elbow Wrist 3.7 22.0 59 >50  B Elbow    6.0  9.1  A Elbow B Elbow 1.5 10.0 67 >50  A Elbow    7.5  9.0          Comparison Summary Table   Stim Site NR Peak (ms) Norm Peak (ms) P-T Amp (V) Site1 Site2 Delta-P (ms) Norm Delta (ms)  Left Median/Ulnar Palm Comparison (Wrist - 8cm)  33C  Median Palm    1.7 <2.2 77.2 Median Palm Ulnar Palm 0.1   Ulnar Palm    1.6 <2.2 25.9       H Reflex Studies  NR H-Lat (ms) Lat Norm (ms) L-R H-Lat (ms)  Left Tibial (Gastroc)  33C     31.16 <35    EMG   Side Muscle Ins Act Fibs Psw Fasc Number Recrt Dur Dur. Amp Amp. Poly Poly. Comment  Left AntTibialis Nml Nml Nml Nml Nml Nml Nml Nml Nml Nml Nml Nml N/A  Left Gastroc Nml Nml Nml Nml Nml Nml Nml Nml Nml Nml Nml Nml N/A  Left Flex Dig Long Nml Nml Nml Nml Nml Nml Nml Nml Nml Nml Nml Nml N/A  Left RectFemoris Nml Nml Nml Nml Nml Nml Nml Nml Nml Nml Nml Nml N/A  Left GluteusMed Nml Nml Nml Nml Nml Nml Nml Nml Nml Nml Nml Nml N/A  Left 1stDorInt Nml Nml Nml Nml Nml Nml Nml Nml Nml Nml Nml Nml N/A  Left PronatorTeres Nml Nml Nml Nml Nml Nml Nml Nml Nml Nml Nml Nml N/A  Left Biceps Nml Nml Nml Nml Nml Nml Nml Nml Nml Nml Nml Nml N/A  Left Triceps Nml Nml Nml Nml Nml Nml Nml Nml Nml Nml Nml Nml N/A  Left Deltoid Nml Nml Nml Nml Nml Nml Nml Nml Nml Nml Nml Nml N/A      Waveforms:

## 2021-01-04 ENCOUNTER — Other Ambulatory Visit: Payer: Self-pay

## 2021-01-04 ENCOUNTER — Ambulatory Visit (INDEPENDENT_AMBULATORY_CARE_PROVIDER_SITE_OTHER): Payer: BC Managed Care – PPO | Admitting: Psychiatry

## 2021-01-04 DIAGNOSIS — F411 Generalized anxiety disorder: Secondary | ICD-10-CM | POA: Diagnosis not present

## 2021-01-04 NOTE — Progress Notes (Signed)
[facility] Counselor Initial Adult Exam  Name: Samantha James Date: 01/04/2021 MRN: [id number] DOB: February 14, 1966 PCP: Juluis Rainier, MD   Time spent: 60 minutes   4:00pm to 5:00pm   Guardian/Payee:  patient  Paperwork requested:  No   Reason for Visit /Presenting Problem: grief (husband died of colon cancer), anxiety, depression, anger, frustration, difficulty with her 55 yr old daughter. Overwhelmed. "I thought I wouldn't have so much grief at this point."   Mental Status Exam:   Appearance:   Well Groomed     Behavior:  Appropriate, Sharing and Motivated  Motor:  Normal  Speech/Language:   Clear and Coherent  Affect:  anxious, depressed  Mood:  anxious and depressed  Thought process:  goal directed  Thought content:    WNL  Sensory/Perceptual disturbances:    WNL  Orientation:  oriented to person, place, time/date, situation, day of week, month of year and year  Attention:  Poor  Concentration:  Poor  Memory:  forgetting and worse under stress  Fund of knowledge:   Good  Insight:    Good and Fair  Judgment:   Good  Impulse Control:  Good   Reported Symptoms:  See symptoms above  Risk Assessment: Danger to Self:  No Self-injurious Behavior: No Danger to Others: No Duty to Warn:no Physical Aggression / Violence:No  Access to Firearms a concern: No  Gang Involvement:No  Patient / guardian was educated about steps to take if suicide or homicide risk level increases between visits: Denies any SI. While future psychiatric events cannot be accurately predicted, the patient does not currently require acute inpatient psychiatric care and does not currently meet [facility] involuntary commitment criteria.  Substance Abuse History: Current substance abuse: No     Past Psychiatric History:   Previous psychological history is significant for anxiety and depression Outpatient Providers:n/a History of Psych Hospitalization: No  Psychological Testing: n/a   Abuse  History: Victim of No., n/a   Report needed: No. Victim of Neglect:No. Perpetrator of n/a  Witness / Exposure to Domestic Violence: No   Protective Services Involvement: No  Witness to [employer] Violence:  No   Family History: Patient confirms info below. Family History  Problem Relation Age of Onset  . Congestive Heart Failure Father     Living situation: the patient lives with their family (husband is deceased, son age 66 at college [employer], and daughter age 10 in 9th grade.   Sexual Orientation:  Straight  Relationship Status: widowed  Name of spouse / other:n/a             If a parent, number of children / ages: 54, 13  Support Systems; friends, attends a  Chief Executive Officer" and not very involved.  Financial Stress:  No   Income/Employment/Disability: Employment  Financial planner: No   Educational History: Education: Risk manager:   Protestant  Any cultural differences that may affect / interfere with treatment:  not applicable   Recreation/Hobbies: music, wanting to find some activities  Stressors:Loss of husband's death , need to change eating habits being diabetic  Strengths:  Supportive Relationships, Family, Friends, Spirituality, Hopefulness and Able to Communicate Effectively  Barriers:   "don't know but I don't think there will be a barrier because I want to get better."  Legal History: Pending legal issue / charges: The patient has no significant history of legal issues. History of legal issue / charges: n/a  Medical History/Surgical History:Reviewed with  Patient and she confirms info below.  Past Medical History:  Diagnosis Date  . Diabetes mellitus without complication (HCC)   . Hyperglycemia   . Hypertension     Past Surgical History:  Procedure Laterality Date  . CESAREAN SECTION  2006  . CESAREAN SECTION  2001    Medications: Patient confirms info below. Current Outpatient Medications  Medication Sig  Dispense Refill  . glimepiride (AMARYL) 1 MG tablet Take 2 mg by mouth every morning.    . lovastatin (MEVACOR) 20 MG tablet Take 20 mg by mouth every morning.     . metFORMIN (GLUCOPHAGE) 500 MG tablet Take by mouth 2 (two) times daily with a meal.    . Multiple Vitamins-Minerals (MULTIVITAL PO) Take by mouth every morning.     No current facility-administered medications for this visit.    Allergies  Allergen Reactions  . Penicillins     Rash all over    Diagnoses:    ICD-10-CM   1. Generalized anxiety disorder  F41.1     Plan of Care:  Patient not signing treatment goal plan on computer screen due to Covid.  Treatment Goals:  Treatment goals remain on treatment plan as patient works with strategy to achieve her goals. Progress will be assessed each visit and documented in "progress" section of note.   Long term goal: Appropriately manage the left over feelings patient is experiencing from husband's death, in order to normalize mood and return to previous adaptive level of functioning.  Short term goal: Verbally expressed understanding of the relationship between depressed mood and repression of feelings, such as anger hurt and sadness.  Strategy: Make positive statements regarding self and ability to cope with stresses of life.  Progress: This is patient's first session today and we completed her initial evaluation for therapy and her treatment goal plan.  She is coming for treatment after having lost her husband to colon cancer and experiencing a wide variety of issues relating to anxiety, grief, depression, mixed feelings, anger, frustration, and needing help sorting things out emotionally.  There was some dysfunction within the relationship prior to his illness and there were challenges that they faced as a marriage couple but patient had hopes for a better future.  Questions a lot of things and situations and feelings between her and husband for some reasons that are not  mentioned in this note due to patient confidentiality. Worked on her treatment goal plan together and patient is motivated to start right away working on her goals.  Reviewed her goal plan with her and she is in agreement.  Next appointment within 2 weeks.   Mathis Fare, LCSW

## 2021-01-19 ENCOUNTER — Other Ambulatory Visit: Payer: Self-pay

## 2021-01-19 ENCOUNTER — Ambulatory Visit (INDEPENDENT_AMBULATORY_CARE_PROVIDER_SITE_OTHER): Payer: BC Managed Care – PPO | Admitting: Psychiatry

## 2021-01-19 DIAGNOSIS — F411 Generalized anxiety disorder: Secondary | ICD-10-CM | POA: Diagnosis not present

## 2021-01-19 NOTE — Progress Notes (Signed)
[facility] Counselor/Therapist Progress Note  Patient ID: Samantha James, MRN: [id number],    Date: 01/19/2021  Time Spent: 60 minutes    8:00am to 9:00am  Treatment Type: Individual Therapy  Reported Symptoms: anxiety "my main symptom", depression, anger, frustration, grief (husband's death due to colon cancer), difficulty with 55 yr old daughter)   Mental Status Exam:  Appearance:   Well Groomed     Behavior:  Appropriate, Sharing and Motivated  Motor:  Normal  Speech/Language:   Clear and Coherent  Affect:  anxious, depressed, frustrated, anger  Mood:  angry, anxious, depressed and sad  Thought process:  goal directed  Thought content:    WNL  Sensory/Perceptual disturbances:    WNL  Orientation:  oriented to person, place, time/date, situation, day of week, month of year and year  Attention:  Good  Concentration:  Fair  Memory:  some forgetfulness began after husband's death  Fund of knowledge:   Good  Insight:    Good  Judgment:   Good  Impulse Control:  Good   Risk Assessment: Danger to Self:  No Self-injurious Behavior: No Danger to Others: No Duty to Warn:no Physical Aggression / Violence:No  Access to Firearms a concern: No  Gang Involvement:No   Subjective: Patient today reporting anxiety, depression, grief, anger, frustration, and overwhelmedness.  Difficulty dealing with 23 year old daughter. "don't feel like I'm managing things well but staying above water".  Interventions: Cognitive Behavioral Therapy and Solution-Oriented/Positive Psychology  Diagnosis:   ICD-10-CM   1. Generalized anxiety disorder  F41.1      Plan of Care:  Patient not signing treatment goal plan on computer screen due to Covid.  Treatment Goals:  Treatment goals remain on treatment plan as patient works with strategy to achieve her goals. Progress will be assessed each visit and documented in "progress" section of note.   Long term goal: Appropriately manage the left  over feelings patient is experiencing from husband's death, in order to normalize mood and return to previous adaptive level of functioning.  Short term goal: Verbally expressed understanding of the relationship between depressed mood and repression of feelings, such as anger hurt and sadness.  Strategy: Make positive statements regarding self and ability to cope with stresses of life.  Progress: Patient in today reporting anxiety as the stronger symptom, along with grief, depression, frustration, anger, and difficulty dealing with 60 year old daughter.  Her grief continues regarding her husband's death of colon cancer which came on quite quickly and ended his life quickly. "Not managing things well and feels like the anxiety is her biggest problem and difficulty doing things with family (sisters who tend to stir drama and controlling)." "And they try to guilt me". Hard to have good boundaries as they push beyond my boundaries. Recent incident involving family happened since last appt.which she shared as example of poor boundaries within family.  Discussed patient being able to begin setting boundaries with sisters especially and how she might implement them and respond if/when there is resistance to boundaries set.  Also shared more about her anger and needing help with it, has been trying to stuff it.  From listening to patient talk and process feelings, is obvious she is very frustrated with herself and is limiting her self-care in some ways.  Looked at multiple ways where she could be more self caring and not see it as "being selfish" and also worked to let go of so many expectations that she senses from others in  the family and focus more on herself and her kids including much better self-care and self talk.  Processed some of her grief but stated today she was really needing to talk through the boundary issues and some of what she is feeling personally "that is not just about my grief", which is  what we did use the session for.  To follow-up on self-care and self talk strategies and recommendations as well as practiced some boundary setting within her extended family, and we will pick up on this next session.   Goal review and progress/challenges noted with patient.  Next appointment within 2 weeks.   Mathis Fare, LCSW

## 2021-01-28 ENCOUNTER — Other Ambulatory Visit: Payer: Self-pay

## 2021-01-28 ENCOUNTER — Ambulatory Visit (INDEPENDENT_AMBULATORY_CARE_PROVIDER_SITE_OTHER): Payer: BC Managed Care – PPO | Admitting: Psychiatry

## 2021-01-28 DIAGNOSIS — F411 Generalized anxiety disorder: Secondary | ICD-10-CM

## 2021-01-28 NOTE — Progress Notes (Signed)
[facility] Counselor/Therapist Progress Note  Patient ID: Samantha James, MRN: [id number],    Date: 01/28/2021  Time Spent: 60 minutes   8:00am to 9:00am   Treatment Type: Individual Therapy  Reported Symptoms: anxiety, some unresolved grief, family and personal concerns, depression  Mental Status Exam:  Appearance:   Casual     Behavior:  Appropriate, Sharing and Motivated  Motor:  Normal  Speech/Language:   Clear and Coherent  Affect:  anxious  Mood:  anxious and depressed  Thought process:  goal directed  Thought content:    WNL  Sensory/Perceptual disturbances:    WNL  Orientation:  oriented to person, place, time/date, situation, day of week, month of year and year  Attention:  Fair  Concentration:  Fair  Memory:  WNL  Fund of knowledge:   Good  Insight:    Good and Fair  Judgment:   Good  Impulse Control:  Fair   Risk Assessment: Danger to Self:  No Self-injurious Behavior: No Danger to Others: No Duty to Warn:no Physical Aggression / Violence:No  Access to Firearms a concern: No  Gang Involvement:No   Subjective: Patient today reporting anxiety, depression, some unresolved grief, personal and family concerns, overthinking.  Interventions: Cognitive Behavioral Therapy, Solution-Oriented/Positive Psychology and Grief Therapy  Diagnosis:   ICD-10-CM   1. Generalized anxiety disorder  F41.1      Plan of Care: Patient not signing treatment goal plan on computer screen due to Covid.  Treatment Goals: Treatment goals remain on treatment plan as patient works with strategy to achieve her goals. Progress will be assessed each visit and documented in "progress" section of note.   Long term goal: Appropriately manage the left over feelings patient is experiencing from husband's death, in order to normalize mood and return to previous adaptive level of functioning.  Short term goal: Verbally expressed understanding of the relationship between  depressed mood and repression of feelings, such as anger hurt and sadness.  Strategy: Make positive statements regarding self and ability to cope with stresses of life.  Progress: Patient in today reporting anxiety, depression, family and personal concerns, overthinking, and overwhelmed at times.  Anxiety "is my worst symptom, and is some better, I try to tune out some of the demanding and unhelpful people in life. Shared some recent interactions with others that have been an issue for her. Dealing with lots of hostility, misunderstanding, and communication difficulties within patient's 4 adult sisters. Family dysfunction impacting patient's unresolved grief and we processed patient's thoughts and feelings about this and able to talk through her frustration, anger, misunderstanding. Also discussed how she might better communicate with sisters to try and move forward and have some healing in their relationships with each other.  Used short term goal with patient today working on some of her anger and hurt within family and beyond, which relates to some unresolved grief for patient.  Worked also on modulation of some emotion when talking and how that helps in understanding and being heard.  Encouraged patient in more positive self-care, staying in contact with people who are supportive of her, practicing positive self talk, setting and keeping appropriate boundaries with others as needed, trying not to assume she knows what others are always feeling, being able to talk without a lot of emotions attached especially so she can be heard better by certain people, healthy nutrition and exercise, taking breaks occasionally during the day, staying in the present focusing on what she can control, and  being aware of the strength she is showing as she works in very difficult situations and circumstances to move forward.  Goal review and progress/challenges noted with patient.  Next appointment within 2 to 3  weeks.   Mathis Fare, LCSW

## 2021-02-16 ENCOUNTER — Ambulatory Visit: Payer: BC Managed Care – PPO | Admitting: Psychiatry

## 2021-02-18 ENCOUNTER — Ambulatory Visit: Payer: BC Managed Care – PPO | Admitting: Psychiatry

## 2021-03-09 ENCOUNTER — Ambulatory Visit (INDEPENDENT_AMBULATORY_CARE_PROVIDER_SITE_OTHER): Payer: BC Managed Care – PPO | Admitting: Psychiatry

## 2021-03-09 ENCOUNTER — Other Ambulatory Visit: Payer: Self-pay

## 2021-03-09 DIAGNOSIS — F411 Generalized anxiety disorder: Secondary | ICD-10-CM

## 2021-03-09 NOTE — Progress Notes (Signed)
[facility] Counselor/Therapist Progress Note  Patient ID: Samantha James, MRN: [id number],    Date: 03/09/2021  Time Spent: 60 minutes   8:00am to 9:00am  Treatment Type: Individual Therapy  Reported Symptoms: anxiety, angry, grief, "some people tell me I'm mean" and can be angry/loud/aggressive  Mental Status Exam:  Appearance:   Neat     Behavior:  Appropriate, Sharing and Motivated  Motor:  Normal  Speech/Language:   Clear and Coherent  Affect:  anxious, grief, short tempered at times,  Mood:  anxious and some grief and short-tempered  Thought process:  goal directed  Thought content:    some obsessiveness  Sensory/Perceptual disturbances:    WNL  Orientation:  oriented to person, place, time/date, situation, day of week, month of year and year  Attention:  Good  Concentration:  Good and Fair  Memory:  WNL  Fund of knowledge:   Good  Insight:    Good and Fair  Judgment:   Good and Fair  Impulse Control:  Fair   Risk Assessment: Danger to Self:  No Self-injurious Behavior: No Danger to Others: No Duty to Warn:no Physical Aggression / Violence:No  Access to Firearms a concern: No  Gang Involvement:No   Subjective:  Patient today reporting anxiety, anger, grief, difficulty in relationships.  See Progress Note below.   Interventions: Solution-Oriented/Positive Psychology and Grief Therapy  Diagnosis:   ICD-10-CM   1. Generalized anxiety disorder  F41.1      Plan of Care: Patient not signing treatment goal plan on computer screen due to Covid.  Treatment Goals: Treatment goals remain on treatment plan as patient works with strategy to achieve her goals. Progress will be assessed each visit and documented in "progress" section of note.   Long term goal: Appropriately manage the left over feelings patient is experiencing from husband's death, in order to normalize mood and return to previous adaptive level of functioning.  Short term  goal: Verbally expressed understanding of the relationship between depressed mood and repression of feelings, such as anger hurt and sadness.  Strategy: Make positive statements regarding self and ability to cope with stresses of life.  Progress: Patient in today reporting daily anxiety, anger, grief, leading to difficulty in family and other relationships. Symptoms she acknowledges is very much related to her unresolved grief, some of which is more "complicated grief" at this point.  Using her long and short term goals in tx plan above, we worked today in session on helping patient see the connection between unresolved grief and her having so many mixed feelings leading to more anger and anxiety and is negatively impacting her relationship with others. Several friends and family have told her "you are mean" and she's realizing now how her" behavior can be easily seen as being mean and snappy, pushing people away. " Much more insight shown today and more initiative in looking forward to changing in ways that will help her and her 2 children.  Worked with specific examples and helping patient see different ways to express herself and be better understood rather than just reacting in "mean and snappy ways".  Not feeling quite as overwhelmed and is still needing work on overthinking.  Still confronting hostility, misunderstanding, and communication difficulties with patient's 4 adult sisters that contributes to family dysfunction and impacts patient's unresolved grief.  Reviewed some of our previous work on better communication and modulation of emotions when interacting with others.  Encouraged patient and trying not to assume she  knows what others are always thinking or feeling, practice more positive self-care and self talk, remain in contact with people who are supportive, set and keep appropriate boundaries with others as needed, being able to talk without a lot of excessive emotions attached so as to be  better heard by certain people, taking breaks occasionally throughout the day, healthy nutrition and exercise, staying in the present focusing on what she can change or control, and to feel good about the strength she is showing in the midst of difficult circumstances as she tries to move forward in a positive direction.  Goal review and progress/challenges noted with patient.  Next appointment within 2 to 3 weeks.    Mathis Fare, LCSW

## 2021-03-28 ENCOUNTER — Other Ambulatory Visit: Payer: Self-pay

## 2021-03-28 ENCOUNTER — Encounter: Payer: Self-pay | Admitting: Registered"

## 2021-03-28 ENCOUNTER — Encounter: Payer: BC Managed Care – PPO | Attending: Family Medicine | Admitting: Registered"

## 2021-03-28 DIAGNOSIS — E118 Type 2 diabetes mellitus with unspecified complications: Secondary | ICD-10-CM | POA: Diagnosis present

## 2021-03-28 NOTE — Progress Notes (Signed)
Diabetes Self-Management Education  Visit Type: First/Initial  Appt. Start Time: 0935 Appt. End Time: 1045  03/28/2021  Samantha James, identified by name and date of birth, is a 55 y.o. female with a diagnosis of Diabetes: Type 2.   ASSESSMENT  There were no vitals taken for this visit. There is no height or weight on file to calculate BMI.   Diet: Pt reported diet sounds balanced other than eating candy at times, no sweetened beverages.  Stress: Pt reports her husband passed away 02/27/2018 and has been focused on taking care of children.   SMBG: Pt states she has ordered a new meter and plans to start checking blood sugar more often.Pt states when she drinks wine in the evening earlier rather than later she tends to have a better FBS.  Exercise: Patient states only time to exercise is  right after work. Pt states daughter likes to exercise with her. Pt states she likes walking the dog outside in the Spring due to weather. Pt reports there is a gym across the street. Pt reports knee issues, including torn meniscus, surgery ~6 yrs ago.     Diabetes Self-Management Education - 03/28/21 0947      Visit Information   Visit Type First/Initial      Initial Visit   Diabetes Type Type 2    Are you currently following a meal plan? No    Are you taking your medications as prescribed? No    Date Diagnosed 2006[birth year]      Health Coping   How would you rate your overall health? Fair      Psychosocial Assessment   Patient Belief/Attitude about Diabetes Motivated to manage diabetes    How often do you need to have someone help you when you read instructions, pamphlets, or other written materials from your doctor or pharmacy? 1 - Never    What is the last grade level you completed in school? 4 yr college      Complications   Last HgB A1C per patient/outside source 8 %    How often do you check your blood sugar? 3-4 times / week    Fasting Blood glucose range (mg/dL) [phone]    Have you had a  dilated eye exam in the past 12 months? Yes    Have you had a dental exam in the past 12 months? Yes    Are you checking your feet? Yes    How many days per week are you checking your feet? 7      Dietary Intake   Breakfast eats at desk something like carb master yogurt, oatmeal, fruit, sausage corn dog, 1/2 bagel with cream cheese or peanut butter    Snack (morning) none    Lunch sandwich and chips OR salad OR soup    Snack (afternoon) none    Dinner shrimp tacos OR salmon OR cheeseburger pie OR breakfast food pancakes, eggs, breakfast meat    Snack (evening) none    Beverage(s) 16 oz, coffee no sugar creamer splenda, 6-8 oz pinot grisio wine per night.      Exercise   Exercise Type Light (walking / raking leaves)    How many days per week to you exercise? 3    How many minutes per day do you exercise? 10    Total minutes per week of exercise 30      Patient Education   Previous Diabetes Education Yes (please comment)   28-Feb-2004 GDM education   Physical activity  and exercise  Role of exercise on diabetes management, blood pressure control and cardiac health.    Monitoring Identified appropriate SMBG and/or A1C goals.    Psychosocial adjustment Role of stress on diabetes      Individualized Goals (developed by patient)   Nutrition General guidelines for healthy choices and portions discussed    Physical Activity Exercise 3-5 times per week    Medications take my medication as prescribed   talk to MD about low blood sugar and glimepiride     Outcomes   Expected Outcomes Demonstrated interest in learning. Expect positive outcomes    Future DMSE PRN    Program Status Completed           Individualized Plan for Diabetes Self-Management Training:   Learning Objective:  Patient will have a greater understanding of diabetes self-management. Patient education plan is to attend individual and/or group sessions per assessed needs and concerns.    Patient Instructions  Exercise: 3  days a week go to gym 30-45 minutes Consider building stress management skills. Consider employing some better sleep strategies which may include reducing your wine intake. Aim for 30-45 grams carbs in your meals. Eat balanced meals and snack   Expected Outcomes:  Demonstrated interest in learning. Expect positive outcomes  Education material provided: A1C conversion sheet, non-insulin medications, Planning Healthy Meals.  If problems or questions, patient to contact team via:  Phone  Future DSME appointment: PRN

## 2021-03-28 NOTE — Patient Instructions (Addendum)
Exercise: 3 days a week go to gym 30-45 minutes Consider building stress management skills. Consider employing some better sleep strategies which may include reducing your wine intake. Aim for 30-45 grams carbs in your meals. Eat balanced meals and snack

## 2021-03-30 ENCOUNTER — Ambulatory Visit: Payer: BC Managed Care – PPO | Admitting: Psychiatry

## 2021-04-20 ENCOUNTER — Other Ambulatory Visit: Payer: Self-pay

## 2021-04-20 ENCOUNTER — Ambulatory Visit (INDEPENDENT_AMBULATORY_CARE_PROVIDER_SITE_OTHER): Payer: BC Managed Care – PPO | Admitting: Psychiatry

## 2021-04-20 DIAGNOSIS — F411 Generalized anxiety disorder: Secondary | ICD-10-CM

## 2021-04-20 NOTE — Progress Notes (Signed)
[facility] Counselor/Therapist Progress Note  Patient ID: Samantha James, MRN: [id number],    Date: 04/20/2021  Time Spent: 60 minutes   Treatment Type: Individual Therapy  Reported Symptoms: anxiety, unresolved grief, some resentments (improving)  Mental Status Exam:  Appearance:   Neat     Behavior:  Appropriate, Sharing and Motivated  Motor:  Normal  Speech/Language:   Clear and Coherent  Affect:  anxious  Mood:  anxious  Thought process:  goal directed  Thought content:    some obsessiveness  Sensory/Perceptual disturbances:    WNL  Orientation:  oriented to person, place, time/date, situation, day of week, month of year and year  Attention:  Fair  Concentration:  Fair  Memory:  WNL  Fund of knowledge:   Good  Insight:    Good  Judgment:   Good  Impulse Control:  Fair   Risk Assessment: Danger to Self:  No Self-injurious Behavior: No Danger to Others: No Duty to Warn:no Physical Aggression / Violence:No  Access to Firearms a concern: No  Gang Involvement:No   Subjective:  Patient in today reporting daily anxiety, some grief and anger, some decrease in attention and concentration due to sometimes feeling overwhelmed, and some impulse issues in communication with others and "flying off the handle when people lie to me or do things that frustrate me." When impulsive I just am in less control verbally and say things without thinking." Processing some issues between her and her 2 kids(ages 15 and 20)  and trust issues involved.Difficulties in communication and reasoning with 50 yr old daughter.  Discussed different ways of talking/listening/providing feedback with daughter where daughter might listen and actually have a healthy conversation with patient. Working through more of her unresolved grief issues that have affected family relations and beginning to let go of some anger she has held onto. Working with long and short term goals in tx plan below to support her work  in realizing connection between her anxiety and unresolved grief that has led to more anger and difficulty in family relationships.  Patient showing progress now in this area, not as angry but still some anger, but seems more determined to work on her goals, heal from some of her past issues, and be able to move forward in healthier ways. Insight increasing. Less tempermental and more patient at times. Still some family issues that she is a part of working on, and approaching it in more positive way more recently and is encouraged to continue this.  Reviewed some of the previous work she has done regarding family and communication issues, still working on modulation of emotions especially when interacting and communicating within the family.    Interventions: Solution-Oriented/Positive Psychology, Ego-Supportive and Insight-Oriented  Diagnosis:   ICD-10-CM   1. Generalized anxiety disorder  F41.1      Treatment Goal Plan:  Patient not signing treatment goal plan on computer screen due to Covid. Treatment Goals: Treatment goals remain on treatment plan as patient works with strategy to achieve her goals. Progress will be assessed each visit and documented in "progress" section of note.  Long term goal: Appropriately manage the left over feelings patient is experiencing from husband's death, in order to normalize mood and return to previous adaptive level of functioning.Resolve "left over feelings that are impacting other relationships within family." Short term goal: Verbally expressed understanding of the relationship between anxious/depressed mood and repression of feelings, such as anger, hurt, and sadness. Strategy: Make positive statements regarding  self and ability to cope with stresses of life.    Progress / Plan: Patient today showing improved motivation as indicated in the "Subjective" section of this note above.  Encouraging patient to continue being more positive in her approach  to both personal and family concerns that she continues to work on.  Also encouraged her to practice more consistent positive self-care and self talk, try not to assume that she knows what others are always thinking or feeling and let them express that to her, stay in contact with people who are supportive, set and keep healthy boundaries with others, take breaks occasionally throughout the day, healthy nutrition and exercise, practicing talking without a lot of excessive emotions attached so as to be better heard by certain people, stay in the present focusing on what she can change or control, and realize the strength that she is showing in the midst of difficult and often changing circumstances as she works with goal-directed behaviors to move forward in a more positive direction and improved emotional health.  Goal review and progress/challenges noted with patient.  Next appt within 3 weeks.   Mathis Fare, LCSW

## 2021-05-04 ENCOUNTER — Ambulatory Visit (INDEPENDENT_AMBULATORY_CARE_PROVIDER_SITE_OTHER): Payer: BC Managed Care – PPO | Admitting: Psychiatry

## 2021-05-04 ENCOUNTER — Other Ambulatory Visit: Payer: Self-pay

## 2021-05-04 DIAGNOSIS — F411 Generalized anxiety disorder: Secondary | ICD-10-CM | POA: Diagnosis not present

## 2021-05-04 NOTE — Progress Notes (Signed)
[facility] Counselor/Therapist Progress Note  Patient ID: Samantha James, MRN: [id number],    Date: 05/04/2021  Time Spent: 60 minutes   Treatment Type: Individual Therapy  Reported Symptoms: anxiety, depression and sadness have decreased  Mental Status Exam:  Appearance:   Well Groomed     Behavior:  Appropriate, Sharing, and Motivated  Motor:  Normal  Speech/Language:   Clear and Coherent  Affect:  Anxious  Mood:  anxious  Thought process:  goal directed  Thought content:    Some obsessiveness  Sensory/Perceptual disturbances:    WNL  Orientation:  oriented to person, place, time/date, situation, day of week, month of year, and year  Attention:  Good  Concentration:  Good  Memory:  WNL  Fund of knowledge:   Good  Insight:    Good  Judgment:   Good  Impulse Control:  Good   Risk Assessment: Danger to Self:  No Self-injurious Behavior: No Danger to Others: No Duty to Warn:no Physical Aggression / Violence:No  Access to Firearms a concern: No  Gang Involvement:No   Subjective:  Patient in today reporting anxiety, some "mixed sad feelings", some unresolved grief.  Seeing how anxiety "affects my attention and concentration".  Looked at the anxious messages that patient deals with and that leads to her attention and concentration issues.  Worked some today with specific examples of anxious/self-doubting and see how they negatively affect patient's concentration/attention, and how to manage the anxiety in more effective ways. Feeling some better and less overwhelmed.  Family contacts have gone some better on some occasions, and is still working on improved Manufacturing systems engineer (listening and speaking, "how we say what we say") . To go on first vacation since husband's death, and will take her kids to [place] this coming week and processed some anxiety and some bittersweetness about this as "it reminds me more of how husband always took care of these details."  Less  impulsiveness in communication. Continued work on unresolved grief issues and making progress. Anger decreasing gradually. More determination to work on goals and eventually move forward. Having some rough times still, but trying to navigate them better, as evidenced by her report and behavior.  Denies any SI at this time.  Also continuing work on modulation of emotions and relationship and communication with family and at work.  Interventions: Cognitive Behavioral Therapy and Insight-Oriented  Diagnosis:   ICD-10-CM   1. Generalized anxiety disorder  F41.1       Plan:  Patient showing better motivation today and seems to recognize some progress in how she is feeling and some hope for the future.  She is beginning to be more engaged and seeing some results and strategies she has been using to address symptoms of anxiety, depression, sadness, and overwhelmedness.  Encouraging patient to keep her positive approach and personal and family concerns as that is helping feed her progress.  Also encouraged her to practice some behaviors that have proven to be helpful in the recent past including trying not to assume that she always knows what others are thinking or feeling and that them express what they are thinking or feeling to her.  Also encouraged her to practice more consistent positive self talk, stay in contact with people who are supportive, take breaks occasionally for herself, set and keep healthy boundaries with others, healthy nutrition and exercise, staying in the present focusing on what she can control, practicing talking without a lot of excessive emotions attached so as  to be better heard by people and this relates especially to having a lot of anger attached, and feel good about the strength she is showing as she works with goal-directed behaviors in the midst of challenging and often changing circumstances as she works to move forward in a more positive direction.   Goal review and  progress/challenges noted with patient.  Next appt within 2-3 weeks.    Mathis Fare, LCSW

## 2021-05-18 ENCOUNTER — Other Ambulatory Visit: Payer: Self-pay

## 2021-05-18 ENCOUNTER — Ambulatory Visit (INDEPENDENT_AMBULATORY_CARE_PROVIDER_SITE_OTHER): Payer: BC Managed Care – PPO | Admitting: Psychiatry

## 2021-05-18 DIAGNOSIS — F411 Generalized anxiety disorder: Secondary | ICD-10-CM

## 2021-05-18 NOTE — Progress Notes (Signed)
[facility] Counselor/Therapist Progress Note  Patient ID: MERA GUNKEL, MRN: [id number],    Date: 05/18/2021  Time Spent: 58 minutes   Treatment Type: Individual Therapy  Reported Symptoms: anxiety, depression has decreased  Mental Status Exam:  Appearance:   Casual and Neat     Behavior:  Appropriate, Sharing, and Motivated  Motor:  Normal  Speech/Language:   Clear and Coherent  Affect:  Anxious   Mood:  anxious  Thought process:  goal directed  Thought content:    Some obsessiveness  Sensory/Perceptual disturbances:    WNL  Orientation:  oriented to person, place, time/date, situation, day of week, month of year, year, and stated date of May 18, 2021  Attention:  Good  Concentration:  Good  Memory:  WNL  Fund of knowledge:   Good  Insight:    Good and Fair  Judgment:   Good  Impulse Control:  Good   Risk Assessment: Danger to Self:  No Self-injurious Behavior: No Danger to Others: No Duty to Warn:no Physical Aggression / Violence:No  Access to Firearms a concern: No  Gang Involvement:No   Subjective:  Patient in today reporting daily anxiety. Her depression has decreased significantly. Time away with her 2 kids recently went well. Healing through her grief over loss of husband.  Feels good about how she managed everything on the trip she and her kids took recently, as husband used to always "handle travel arrangements, etc." "I think I'm ready to move forward", and explained being open to potential relationship. Processed today in session her thoughts and feelings related to healing that she is experiencing as well as her thoughts and feelings of contemplating "moving on" in terms of being open to eventual relationship and also in believing in herself more to do things she used to not be able to do and the things her husband had always handled. Sadness decreasing. Less self-doubt. Less overwhelmed. I don't feel "woe is me or why me."  Anger decreasing. Progress in  working through her grief is more noticeable today and patient feeling increased strength, self assurance, and motivation.  Recognizes there are still areas in communication and relationships at work and within the family that she is needing and motivated to work on.  Noticeable improvement in her overall outlook on herself and her situation.   Interventions: Cognitive Behavioral Therapy, Solution-Oriented/Positive Psychology, and Insight-Oriented  Diagnosis:   ICD-10-CM   1. Generalized anxiety disorder  F41.1       Treatment Goal Plan:  Patient not signing treatment goal plan on computer screen due to Covid. Treatment Goals:  Treatment goals remain on treatment plan as patient works with strategy to achieve her goals. Progress will be assessed each visit and documented in "progress" section of note. Long term goal: Appropriately manage the left over feelings patient is experiencing from husband's death, in order to normalize mood and return to previous adaptive level of functioning. Short term goal: Verbally expressed understanding of the relationship between depressed mood and repression of feelings, such as anger hurt and sadness. Strategy: Make positive statements regarding self and ability to cope with stresses of life.    Plan:  Patient today showing good motivation as she continues working on healing through her husband's death and eventually moving forward.  Increasing engagement in activities and relationships.  As noted above was able to go on a vacation with her 2 kids and a sister and that was definitely a positive for patient.  Encouraging  patient to practice some behaviors that have proven to be helpful in between appointments including: Not assuming that she knows what others are always thinking or feeling, practice her more positive approach with people, healthy nutrition and exercise, practice consistent positive self talk, take breaks occasionally throughout the day, stay  in contact with people who are supportive, set and keep healthy boundaries with others as needed, being able to talk without a lot of excessive emotions attached so as to be better heard as this was one of her concerns, staying in the present focusing on what she can control, continue working on expressing her anger in healthier ways, working on modulating her emotions related to anger as expressed in some relationships, and recognizing the strengths she is showing as she works with goal directed behaviors in the midst of challenging and often changing circumstances as she works to move forward in a more positive direction of improved emotional health.  Goal review and progress/challenges noted with patient.  Next appointment within 3 weeks.   Mathis Fare, LCSW

## 2021-06-07 ENCOUNTER — Ambulatory Visit: Payer: BC Managed Care – PPO | Admitting: Psychiatry

## 2021-06-21 ENCOUNTER — Other Ambulatory Visit: Payer: Self-pay

## 2021-06-21 ENCOUNTER — Ambulatory Visit (INDEPENDENT_AMBULATORY_CARE_PROVIDER_SITE_OTHER): Payer: BC Managed Care – PPO | Admitting: Psychiatry

## 2021-06-21 DIAGNOSIS — F411 Generalized anxiety disorder: Secondary | ICD-10-CM | POA: Diagnosis not present

## 2021-06-21 NOTE — Progress Notes (Signed)
[facility] Counselor/Therapist Progress Note  Patient ID: Samantha James, MRN: [id number],    Date: 06/21/2021  Time Spent: 50 minutes  Treatment Type: Individual Therapy  Reported Symptoms: anxiety, frustrated, "depression is better"  Mental Status Exam:  Appearance:   Well Groomed     Behavior:  Appropriate, Sharing, and Motivated  Motor:  Normal  Speech/Language:   Clear and Coherent  Affect:  anxious  Mood:  anxious and frustrated  Thought process:  goal directed  Thought content:    WNL  Sensory/Perceptual disturbances:    WNL  Orientation:  oriented to person, place, time/date, situation, day of week, month of year, year, and stated date of Aug. 9, 2022  Attention:  Fair  Concentration:  Fair  Memory:  WNL  Fund of knowledge:   Good  Insight:    Good  Judgment:   Good  Impulse Control:  Fair   Risk Assessment: Danger to Self:  No Self-injurious Behavior: No Danger to Others: No Duty to Warn:no Physical Aggression / Violence:No  Access to Firearms a concern: No  Gang Involvement:No   Subjective:   Patient in today reporting anxiety and worked today on some issues we've tagged before in session involving being hurt by others in the family through things that were done or said in very hurtful ways. "I've always been a cut-throat person, if someone hurts me we're done there is no apologizing and making up nor working through things, I'm done."  Is working to change her attitude as she realizes "life can be short".  Discussed multiple situations and people that are involved, as well as looking at her own attitude, mindset,  and behavior and the outcomes of it. Wanting to make changes as to her behavior with others and knows she can't change all at once. Feels like a good starting place might be to "give people the benefit of the doubt" and "be more patient and not so judging". Agrees that this is good place for her to start in changing her behavior.  Processed how this  may play out for her at work and home.  Committed and has good attitude, showing more openness to change.  Depression decreasing.  Sadness continues to decrease.  Anxiety decreasing gradually. Anger and self-doubt are lessening. Continues to work through grief issues and gaining in perspective, strength, self-awareness, and motivation for her treatment goals.   Interventions: Cognitive Behavioral Therapy and Insight-Oriented  Diagnosis:   ICD-10-CM   1. Generalized anxiety disorder  F41.1       Treatment Goal Plan:  Patient not signing treatment goal plan on computer screen due to Covid. Treatment Goals:  Treatment goals remain on treatment plan as patient works with strategy to achieve her goals. Progress will be assessed each visit and documented in "progress" section of note. Long term goal: Appropriately manage the left over feelings patient is experiencing from husband's death, in order to normalize mood and return to previous adaptive level of functioning. Short term goal: Verbally expressed understanding of the relationship between depressed mood and repression of feelings, such as anger hurt and sadness. Strategy: Make positive statements regarding self and ability to cope with stresses of life.    Plan:  Patient today showing really good motivation and insight as she continues working on healing through her husband's death, dealing with multiple personal and family and relationship issues as she tries to eventually move forward.  Confirmed for her today that all she has been doing  and is doing now to work on her treatment goals as a part of her moving forward so she is "already moving forward and it is not just something that will happen to her in the future".  Her engagement in outside activities and relationships is increasing gradually but her willingness to confront some of her harder issues has improved a lot recently.  Becoming a little more patient with others.  Encouraged  patient to use some behaviors that have been helpful previously including: Practice consistent positive self talk, not assuming that she knows what others are always thinking or feeling, giving people the benefit of the doubt, healthy nutrition and exercise, practice a more positive approach with people, take breaks occasionally throughout the day, use journaling as a tool with her stress and anxiety and anger, stay in contact with people who are supportive, set and keep healthy boundaries with others as needed, modulate her emotions when dealing with other people, staying in the present focusing on what she can control, continue working on expressing her anger in healthier ways, practicing more patience with people, work to stop judging people, and feel good about the strength she is showing as she works with goal-directed behaviors in the midst of challenging circumstances in order to move forward in a more positive direction of improved emotional health.  Goal review and progress/challenges noted with patient.  Next appointment within 2 to 3 weeks.   Mathis Fare, LCSW

## 2021-07-05 ENCOUNTER — Ambulatory Visit: Payer: BC Managed Care – PPO | Admitting: Psychiatry

## 2021-07-19 ENCOUNTER — Ambulatory Visit (INDEPENDENT_AMBULATORY_CARE_PROVIDER_SITE_OTHER): Payer: BC Managed Care – PPO | Admitting: Psychiatry

## 2021-07-19 ENCOUNTER — Other Ambulatory Visit: Payer: Self-pay

## 2021-07-19 DIAGNOSIS — F411 Generalized anxiety disorder: Secondary | ICD-10-CM

## 2021-07-19 NOTE — Progress Notes (Signed)
[facility] Counselor/Therapist Progress Note  Patient ID: Samantha James, MRN: [id number],    Date: 07/19/2021  Time Spent: 50 minutes   Treatment Type: Individual Therapy  Reported Symptoms: anxiety (improving), frustration  Mental Status Exam:  Appearance:   Casual     Behavior:  Appropriate, Sharing, and limited motivation  Motor:  Normal  Speech/Language:   Clear and Coherent  Affect:  anxious  Mood:  anxious  Thought process:  goal directed  Thought content:    WNL  Sensory/Perceptual disturbances:    WNL  Orientation:  oriented to person, place, time/date, situation, day of week, month of year, year, and stated date of Sept. 6, 2022  Attention:  Good  Concentration:  Good  Memory:  WNL  Fund of knowledge:   Good  Insight:    Good  Judgment:   Good  Impulse Control:  Good and Fair   Risk Assessment: Danger to Self:  No Self-injurious Behavior: No Danger to Others: No Duty to Warn:no Physical Aggression / Violence:No  Access to Firearms a concern: No  Gang Involvement:No   Subjective:  Patient in today reporting anxiety improving some but also frustrations with job, family, and interpersonal relationships.  "Sometimes have a short fuse" but am making a concentrated effort to manage my frustrations with the kids, but grown people is a different thing."  Conversation with supervisor at work stressful. Patient quite stressed over work situations and freely vented her thoughts/feelings/frustrations, looking especially at how she expresses herself verbally and trying to not "have a tone or get overly frustrated with situations and people." Struggles with letting go when people hurt me and discussed several examples in session today. Follow up from last session and not being rigid and holding onto her anger, and then shutting others out. Discussed and practiced better ways of communicating especially "active listening to really hear the other person" before responding and  also paying attention to "how I say what I say", as patient is still working on decreasing her sharpness in conversations, and setting boundaries more consistently as needed. Is working to try and "give people the benefit of the doubt and be less judging." Sadness not as consistently strong. Shows opening to change and trying to work on her rigidness at times. Sadness and anxiety improving some but the anger is challenging as she works through some difficult feelings and situations along with some residual grief over husband's death.   Interventions: Cognitive Behavioral Therapy, Grief Therapy, and Insight-Oriented  Diagnosis:   ICD-10-CM   1. Generalized anxiety disorder  F41.1       Treatment Goal Plan:  Patient not signing treatment goal plan on computer screen due to Covid. Treatment Goals:  Treatment goals remain on treatment plan as patient works with strategy to achieve her goals. Progress will be assessed each visit and documented in "progress" section of note. Long term goal: Appropriately manage the left over feelings patient is experiencing from husband's death, in order to normalize mood and return to previous adaptive level of functioning. Short term goal: Verbally expressed understanding of the relationship between depressed mood and repression of feelings, such as anger hurt and sadness. Strategy: Make positive statements regarding self and ability to cope with stresses of life.    Plan: Patient showing good motivation and as noted above worked well on some communication and an interpersonal relationship issues at work and within the family, and to continue some strategies worked on today.  Continues to work  on more healing around the grief issues of her husband's death, as well as some family relationship healing.  She is recognizing some of her personal growth and some success in working on her goals.  Courage to continue some of the emotional modulation and developing  nonjudgmental ways of relating to people especially when she is angry or frustrated, having some increased activities with other people and working to be more patient and understanding, giving people "the benefit of the doubt sometimes", and acknowledges that this is difficult.  Encouraged her to practice some positive behaviors that have been helpful previously including consistent positive self talk, not assuming that she knows what others are always thinking or feeling, giving people the benefit of the doubt, practice a more positive approach with people, take breaks occasionally throughout the day, healthy nutrition and exercise, use journaling as a tool with her stress and anxiety and anger as appropriate, staying in contact with people who are supportive, set and keep healthy boundaries with others, stay in the present focusing on what she can control, modulate her emotions when dealing with other people or tense situations, continue working on expressing her anger in healthier ways, practicing more patience with people, trying to stop judging people, and feel good about the strength she is showing as she works with goal-directed behaviors in the midst of challenging circumstances in order to move forward in a more positive direction of improved emotional health.  Goal review and progress/challenges noted with patient.  Next appointment within 2 to 3 weeks.  Mathis Fare, LCSW

## 2021-08-02 ENCOUNTER — Ambulatory Visit: Payer: BC Managed Care – PPO | Admitting: Psychiatry

## 2021-08-16 ENCOUNTER — Ambulatory Visit (INDEPENDENT_AMBULATORY_CARE_PROVIDER_SITE_OTHER): Payer: BC Managed Care – PPO | Admitting: Psychiatry

## 2021-08-16 ENCOUNTER — Other Ambulatory Visit: Payer: Self-pay

## 2021-08-16 DIAGNOSIS — F411 Generalized anxiety disorder: Secondary | ICD-10-CM | POA: Diagnosis not present

## 2021-08-16 NOTE — Progress Notes (Signed)
[facility] Counselor/Therapist Progress Note  Patient ID: Samantha James, MRN: [id number],    Date: 08/16/2021  Time Spent: 58 minutes   Treatment Type: Individual Therapy  Reported Symptoms: anxiety  Mental Status Exam:  Appearance:   Well Groomed     Behavior:  Appropriate, Sharing, and Motivated  Motor:  Normal  Speech/Language:   Clear and Coherent and Normal Rate  Affect:  anxious  Mood:  anxious  Thought process:  goal directed  Thought content:    Some obsessiveness  Sensory/Perceptual disturbances:    WNL  Orientation:  oriented to person, place, time/date, situation, day of week, month of year, year, and stated date of Oct. 4, 2022  Attention:  Fair  Concentration:  Fair  Memory:  WNL  Fund of knowledge:   Good  Insight:    Good  Judgment:   Good  Impulse Control:  Fair   Risk Assessment: Danger to Self:  No Self-injurious Behavior: No Danger to Others: No Duty to Warn:no Physical Aggression / Violence:No  Access to Firearms a concern: No  Gang Involvement:No    Subjective: Patient in today reporting anxiety is improving some. Working more with her anger and frustration at times and try to resolve some issues that are contributing to this anger and sometimes short-temperedness. Acknowledges the connection to some unresolved grief re: death of husband which we worked more on today with patient sharing some situations from past (some related to husband) that  tend to surface at times for patients leading to anger, short-temperedness, and anxiety. To continue working on these issues between session allowing herself to confront them and release negative feelings and being able to replace with more calming and self caring thoughts and feelings as she works to let go more of feelings from the past that tended to lead to increased anxiety and unsettledness.  Reports in some cases how she is handling situations better at work and within the family on some occasions,  and wants to increase her ability to manage difficult circumstances or things that do not go as planned.  Is continuing to work with some of her on rigidity and thinking and behaving and realizes some of her progress which she shares today.  Interventions: Solution-Oriented/Positive Psychology, Ego-Supportive, and Insight-Oriented  Diagnosis:   ICD-10-CM   1. Generalized anxiety disorder  F41.1       Treatment Goal Plan:  Patient not signing treatment goal plan on computer screen due to Covid. Treatment Goals:  Treatment goals remain on treatment plan as patient works with strategy to achieve her goals. Progress will be assessed each visit and documented in "progress" section of note. Long term goal: Appropriately manage the left over feelings patient is experiencing from husband's death, in order to normalize mood and return to previous adaptive level of functioning. Short term goal: Verbally expressed understanding of the relationship between depressed mood and repression of feelings, such as anger hurt and sadness. Strategy: Make positive statements regarding self and ability to cope with stresses of life.   Plan:  Patient today showing good motivation, some decrease in her anxiety, and worked well as noted above on issues within her family and her job related to communication and interpersonal relationships, and also some residual grief issues regarding husband's death.  Recognizes areas where she has grown and also areas where she needs to continue to focus on making changes.  Encouraged patient in practicing positive behaviors that have helped her previously including: Healthy nutrition  and exercise, consistent positive self talk, not assuming that she knows what others are always thinking or feeling, giving people more of the benefit of the doubt, practice a more positive approach with people, take breaks occasionally throughout the day, use journaling as a tool with her stress and  anxiety and anger as appropriate, staying in contact with people who are supportive, set and keep healthy boundaries with others, stay in the present focusing on what she can control, modulate her emotions when dealing with other people or tense situations, continue working on expressing her anger in healthier ways, practicing more patience with people, trying to stop judging people, and recognize the strength that she shows as she works with goal-directed behaviors to move forward in a direction that supports improved emotional health and stability.  Goal review and progress/challenges noted with patient.  Next appointment within 3 weeks.  This record has been created using [employer].  Chart creation errors have been sought, but may not always have been located and corrected.  Such creation errors do not reflect on the standard of medical care provided.    Mathis Fare, LCSW

## 2021-09-26 ENCOUNTER — Other Ambulatory Visit: Payer: Self-pay

## 2021-09-26 ENCOUNTER — Ambulatory Visit (INDEPENDENT_AMBULATORY_CARE_PROVIDER_SITE_OTHER): Payer: BC Managed Care – PPO | Admitting: Psychiatry

## 2021-09-26 DIAGNOSIS — F411 Generalized anxiety disorder: Secondary | ICD-10-CM

## 2021-09-26 NOTE — Progress Notes (Signed)
[facility] Counselor/Therapist Progress Note  Patient ID: Samantha James, MRN: [id number],    Date: 09/26/2021  Time Spent: 57 minutes   Treatment Type: Individual Therapy  Reported Symptoms: anxiety (improved)  Mental Status Exam:  Appearance:   Neat     Behavior:  Appropriate, Sharing, and Motivated  Motor:  Normal  Speech/Language:   Clear and Coherent  Affect:  Some anxiety but improved  Mood:  anxious  Thought process:  goal directed  Thought content:    WNL  Sensory/Perceptual disturbances:    WNL  Orientation:  oriented to person, place, time/date, situation, day of week, month of year, year, and stated date of Nov. 14, 2022  Attention:  Fair  Concentration:  Fair  Memory:  WNL  Fund of knowledge:   Good  Insight:    Good and Fair  Judgment:   Good  Impulse Control:  Good and Fair   Risk Assessment: Danger to Self:  No Self-injurious Behavior: No Danger to Others: No Duty to Warn:no Physical Aggression / Violence:No  Access to Firearms a concern: No  Gang Involvement:No   Subjective:   Patient in today reporting anxiety and that she is improving. Noticeable more patience and not as "fussy and able to have more civil conversations with teen and older teen kids, and others."  States she isn't as irritable and continues working on modulating her mood with daughter and others.  Husband's death "left me with more oversight of the kids and I've had to work at it." Looking at things "into the future and letting go more of the past". Anxiety lessening and she's noticing this. Continued work with her frustration and anger. More effort on "the next 5 yrs, continuing to work on me, to be a better person, and find different job, and want to feel more fulfilled." Working through some mixed feelings/relationships within extended family, and confronting/letting to of negative feelings in order to move forward. Encouraged to use "Reset strategy" to interrupt negative thinking  and be able to re-group and focus better on "what's right in front of me versus always thinking ahead. Setting better boundaries within family and at work, and better self-talk as she works to decrease her rigid thinking and responses.  Interventions: Solution-Oriented/Positive Psychology, Ego-Supportive, and Insight-Oriented  Diagnosis:   ICD-10-CM   1. Generalized anxiety disorder  F41.1      Treatment Goal Plan:  Patient not signing treatment goal plan on computer screen due to Covid. Treatment Goals:  Treatment goals remain on treatment plan as patient works with strategy to achieve her goals. Progress will be assessed each visit and documented in "progress" section of note. Long term goal: Appropriately manage the left over feelings patient is experiencing from husband's death, in order to normalize mood and return to previous adaptive level of functioning. Short term goal: Verbally expressed understanding of the relationship between depressed mood and repression of feelings, such as anger hurt and sadness. Strategy: Make positive statements regarding self and ability to cope with stresses of life.   Plan: Patient today showing good motivation and worked consistently on relationship and interpersonal issues within the family and work environment, some attitude shifts that she is making that contribute to her improved self confidence and the way she views other situations a little more positively and is making progress in that area.  Discussed some residual grief issues regarding husband's death but feels she is progressing in that area.  Encouraged her in her practice  of positive behaviors that have helped her previously including: Consistent positive self talk, not assuming that she knows what others are always thinking or feeling, healthy nutrition and exercise, giving people more the benefit of the doubt, using journaling as a tool with her stress and anxiety and anger, staying in  contact with people who are supportive, set and keep healthy boundaries with others, stay in the present focusing on what she can control, modulate her emotions when dealing with other people or tense situations, continue working on expressing her anger in healthier ways, practicing more patience with people, trying to stop judging people, and feel good about the strength she shows working with goal-directed behaviors to move forward in a direction that supports improved emotional health and overall wellbeing.  Goal review and progress/challenges noted with patient.  Next appointment within 3 weeks.  This record has been created using [employer].  Chart creation errors have been sought, but may not always have been located and corrected.  Such creation errors do not reflect on the standard of medical care provided.   Mathis Fare, LCSW

## 2021-10-26 ENCOUNTER — Other Ambulatory Visit: Payer: Self-pay

## 2021-10-26 ENCOUNTER — Ambulatory Visit (INDEPENDENT_AMBULATORY_CARE_PROVIDER_SITE_OTHER): Payer: BC Managed Care – PPO | Admitting: Psychiatry

## 2021-10-26 DIAGNOSIS — F411 Generalized anxiety disorder: Secondary | ICD-10-CM | POA: Diagnosis not present

## 2021-10-26 NOTE — Progress Notes (Signed)
[facility] Counselor/Therapist Progress Note  Patient ID: Samantha James, MRN: [id number],    Date: 10/26/2021  Time Spent: 55 minutes   Treatment Type: Individual Therapy  Reported Symptoms: anxiety (improved), "on an emotional roller coaster with the holidays approaching" and some residual grief over prior loss of husband, stressed (but managing it better)  Mental Status Exam:  Appearance:   Neat     Behavior:  Appropriate, Sharing, and Motivated  Motor:  Normal  Speech/Language:   Clear and Coherent and Normal Rate  Affect:  Some depression, anxiety  Mood:  anxious and some depression, stressed  Thought process:  goal directed  Thought content:    Some overthinking  Sensory/Perceptual disturbances:    WNL  Orientation:  oriented to person, place, time/date, situation, day of week, month of year, year, and stated date of Dec. 14, 2022  Attention:  Good  Concentration:  Good  Memory:  WNL  Fund of knowledge:   Good  Insight:    Good  Judgment:   Good  Impulse Control:  Good and Fair   Risk Assessment: Danger to Self:  No Self-injurious Behavior: No Danger to Others: No Duty to Warn:no Physical Aggression / Violence:No  Access to Firearms a concern: No  Gang Involvement:No   Subjective:  Patient in today reporting anxiety is improving some, increased stress, "roller coaster ride due to holiday times, and some residual grief. Processing more of that grief and some recent situations with her 18 yr old daughter and 47 yr old son that have been insightful for patient. Anxiety is lessening some. Depression decreasing. Patient, even with her current stressors, is feeling some increased self-confidence, smiling some more today, and having more patience with others. Less irritability and some better modulation of her mood especially with her children. Worked more today on her anxiety and depression, and also her reactivity to others and not too quick to respond in curt or  unhelpful ways. Focusing on more acceptance and her lack of control over some things. Mixed feelings/relationships within her extended family trying to let go of negative feelings so as to move forward. Encouraged to use "reset strategy" in interrupting negative thinking and re-group to focus more on "what is right in front of me at that time and not jump ahead."  Boundaries are better within family and with others especially at work. Still working on less rigidity in her thinking an din responding to others.   Interventions: Cognitive Behavioral Therapy, Solution-Oriented/Positive Psychology, and Ego-Supportive    Treatment Goal Plan:  Patient not signing treatment goal plan on computer screen due to Covid. Treatment Goals:  Treatment goals remain on treatment plan as patient works with strategy to achieve her goals. Progress will be assessed each visit and documented in "progress" section of note. Long term goal: Appropriately manage the left over feelings patient is experiencing from husband's death, in order to normalize mood and return to previous adaptive level of functioning. Short term goal: Verbally expressed understanding of the relationship between depressed mood and repression of feelings, such as anger hurt and sadness. Strategy: Make positive statements regarding self and ability to cope with stresses of life.    Diagnosis:   ICD-10-CM   1. Generalized anxiety disorder  F41.1      Plan: Patient today showing good motivation and participation in session as she worked on her anxiety, stress, residual grief, and interpersonal issues and communication within family/friends/coworkers.  Making noticeable progress with her anxiety and  depression and also interpersonally, and in some residual grief.  Noticeable positive attitude shifts that support her improved self confidence and ways of interacting and communicating with others, which she continues to work on.  Encouraged patient and  her practice of positive behaviors that are proving to be helpful to her including:  Consistent positive self talk, Believing in herself more in her ability to make continued changes for better emotional health. Not assuming that she knows what others are always thinking or feeling. Healthy nutrition and exercise. Giving people more the benefit of the doubt. Using journaling as a tool with her stress and anxiety and anger. Staying in contact with people who are supportive. Set and keep healthy boundaries with others. Stay in the present focusing on what she can control. Modulate her emotions when dealing with other people or tense situations. Continue working on expressing her anger in healthier ways. Practicing more patience with people and herself. Refrain from being judgmental. Enjoy spending time with her pet dog who is very therapeutic for patient. For every negative thought, come up with 2 positives. Find the positives within herself. Focus more on the positives versus negatives daily. Recognize the strength she shows working with goal-directed behaviors to move forward in a direction that supports improved emotional health. Goal review and progress/challenges noted with patient.  Next appointment within 3 weeks.  This record has been created using [employer].  Chart creation errors have been sought, but may not always have been located and corrected.  Such creation errors do not reflect on the standard of medical care provided.   Mathis Fare, LCSW

## 2021-11-16 ENCOUNTER — Other Ambulatory Visit: Payer: Self-pay | Admitting: Obstetrics and Gynecology

## 2021-11-16 DIAGNOSIS — Z1231 Encounter for screening mammogram for malignant neoplasm of breast: Secondary | ICD-10-CM

## 2021-11-28 ENCOUNTER — Other Ambulatory Visit: Payer: Self-pay

## 2021-11-28 ENCOUNTER — Ambulatory Visit (INDEPENDENT_AMBULATORY_CARE_PROVIDER_SITE_OTHER): Payer: BC Managed Care – PPO | Admitting: Psychiatry

## 2021-11-28 DIAGNOSIS — F411 Generalized anxiety disorder: Secondary | ICD-10-CM

## 2021-11-28 NOTE — Progress Notes (Signed)
[facility] Counselor/Therapist Progress Note  Patient ID: Samantha James, MRN: [id number],    Date: 11/28/2021  Time Spent: 55 minutes   Treatment Type: Individual Therapy  Reported Symptoms: anxiety  Mental Status Exam:  Appearance:   Neat     Behavior:  Appropriate, Sharing, and Motivated  Motor:  Normal  Speech/Language:   Clear and Coherent  Affect:  anxious  Mood:  anxious  Thought process:  goal directed  Thought content:    WNL  Sensory/Perceptual disturbances:    WNL  Orientation:  oriented to person, place, time/date, situation, day of week, month of year, year, and stated date of Jan. 16, 2023  Attention:  Fair  Concentration:  Fair  Memory:  WNL  Fund of knowledge:   Good  Insight:    Good and Fair  Judgment:   Good  Impulse Control:  Good, "better"   Risk Assessment: Danger to Self:  No Self-injurious Behavior: No Danger to Others: No Duty to Warn:no Physical Aggression / Violence:No  Access to Firearms a concern: No  Gang Involvement:No   Subjective:  Patient in today reporting anxiety regarding personal and home stressors which have escalated recently and patient focused on these stressors today. Still some residual grief re: loss of husband which was some stronger during recent holidays. Increased stress at work and patient did well in venting her stressful thoughts/feelings and be able to accept those feelings more but also work at changing them to be more realistic and empowering, and not "such a weight to carry."  Progress in working through unresolved grief.  Looking at some options for her future in terms of different job. Communication issues with 55 yr old daughter and worked on some changes in patient's responses and also in initiating communication. Seeing how certain situations aggravate her anxiety and frustration, and trying "to make good choices in the moment." Encouraged to use "reset strategy" in better managing negative thinking and  re-grouping to focus more in the present versus "what may be coming up next." Knows she need to continue work on healthy boundaries.   Interventions: Solution-Oriented/Positive Psychology, Ego-Supportive, and Insight-Oriented  Treatment Goal Plan:  Patient not signing treatment goal plan on computer screen due to Covid. Treatment Goals:  Treatment goals remain on treatment plan as patient works with strategy to achieve her goals. Progress will be assessed each visit and documented in "progress" section of note. Long term goal: Appropriately manage the left over feelings patient is experiencing from husband's death, in order to normalize mood and return to previous adaptive level of functioning. Short term goal: Verbally expressed understanding of the relationship between anxiety and depressed mood and repression of feelings, such as anger hurt and sadness. Strategy: Make positive statements regarding self and ability to cope with stresses of life.   Diagnosis:   ICD-10-CM   1. Generalized anxiety disorder  F41.1      Plan: Patient today showing good motivation and participated well in session working on personal, family, and work stressors., and also some residual grief over loss of husband and patient "raising our 2 kids".  More anxious recently with increased stress. Reviewed stress management and also setting healthier boundaries. Processed some parenting issues with both of her children and trying to manage her parental role with self-confidence and as a positive role model for kids.  Does show some progress in managing her depression and anxiety. Trying to have positive attitude, improved self confidence as she interacts with her  children, co-workers, and family (sisters). Encouraged patient in her practice of positive behaviors including:   Reflecting on her progress more frequently. Believing in herself more in her ability to make continued changes for her emotional health. Consistent  positive self talk. Not assuming that she knows what others are always thinking or feeling. Healthy nutrition and exercise. Giving people more of the benefit of the doubt. Staying in contact with people who are supportive. Using journaling as a tool with her stress, anxiety, and anger. Set and keep healthy boundaries with others. Stay in the present focusing on what she can control. Modulate her emotions when dealing with other people or tense situations. Continue working on expressing her anger in healthier ways. Practicing more patience with people and herself. Refrain from being judgmental. Enjoy spending time with her pet dog who is very therapeutic for her.   For every negative thought create 2 positives. Find the positives within herself. Focus more on the positives versus negatives each day. Recognize the strengths she shows working with goal-directed behaviors to move forward in a direction that supports improved emotional health.  Goal review and progress/challenges noted with patient.  Next appointment within 3 weeks.  This record has been created using [employer].  Chart creation errors have been sought, but may not always have been located and corrected.  Such creation errors do not reflect on the standard of medical care provided.  Mathis Fare, LCSW

## 2021-12-20 ENCOUNTER — Ambulatory Visit: Payer: BC Managed Care – PPO

## 2021-12-21 ENCOUNTER — Other Ambulatory Visit: Payer: Self-pay | Admitting: Obstetrics and Gynecology

## 2021-12-21 DIAGNOSIS — R928 Other abnormal and inconclusive findings on diagnostic imaging of breast: Secondary | ICD-10-CM

## 2021-12-26 ENCOUNTER — Ambulatory Visit: Payer: BC Managed Care – PPO | Admitting: Psychiatry

## 2022-01-12 ENCOUNTER — Ambulatory Visit
Admission: RE | Admit: 2022-01-12 | Discharge: 2022-01-12 | Disposition: A | Discharge status: Home / Self Care | Payer: BC Managed Care – PPO | Source: Ambulatory Visit | Attending: Obstetrics and Gynecology | Admitting: Obstetrics and Gynecology

## 2022-01-12 DIAGNOSIS — R928 Other abnormal and inconclusive findings on diagnostic imaging of breast: Secondary | ICD-10-CM

## 2022-01-23 ENCOUNTER — Ambulatory Visit (INDEPENDENT_AMBULATORY_CARE_PROVIDER_SITE_OTHER): Payer: BC Managed Care – PPO | Admitting: Psychiatry

## 2022-01-23 ENCOUNTER — Other Ambulatory Visit: Payer: Self-pay

## 2022-01-23 DIAGNOSIS — F411 Generalized anxiety disorder: Secondary | ICD-10-CM

## 2022-01-23 NOTE — Progress Notes (Signed)
?    [facility] Counselor/Therapist Progress Note ? ?Patient ID: Samantha James, MRN: [id number],   ? ?Date: 01/23/2022 ? ?Time Spent: 55 minutes  ? ?Treatment Type: Individual Therapy ? ?Reported Symptoms: anxiety (improved) ? ?Mental Status Exam: ? ?Appearance:   Neat     ?Behavior:  Appropriate, Sharing, and Motivated  ?Motor:  Normal  ?Speech/Language:   Clear and Coherent  ?Affect:  anxious  ?Mood:  anxious  ?Thought process:  goal directed  ?Thought content:    WNL  ?Sensory/Perceptual disturbances:    WNL  ?Orientation:  oriented to person, place, time/date, situation, day of week, month of year, year, and stated date of January 23, 2022.  ?Attention:  Good  ?Concentration:  Good  ?Memory:  WNL  ?Fund of knowledge:   Good  ?Insight:    Good  ?Judgment:   Good  ?Impulse Control:  Good and Fair  ? ?Risk Assessment: ?Danger to Self:  No ?Self-injurious Behavior: No ?Danger to Others: No ?Duty to Warn: No ?Physical Aggression / Violence:No  ?Access to Firearms a concern: No  ?Gang Involvement:No  ? ?Subjective:   Patient in today reporting anxiety related to personal, family,and work. Trying to manage personal and work stressors especially, more effectively.  Discussed the challenges at home with daughter and at work with difficult people. (Not all details included in this note per patient privacy needs.) Worked on interpersonal skills and communication at work, residual grief issues re: husband's death and how it impacts patient and her high school and college age kids. Progressing through her grief. Considering options beyond her current job and discussed some ongoing stressful issues there and how it impacts her. Shared issues and relationship with sisters and how she is trying to have effective boundaries, which we reviewed and added on some healthy communication skills.  Encouraged her in setting healthy boundaries and using the "reset strategy" as needed to help manage any negative thinking and trying to  remain in the present versus in the past or jumping ahead to imagine "what may be coming up in the future." ? ?Interventions: Solution-Oriented/Positive Psychology and Insight-Oriented ? ?Treatment Goal Plan:  ?Patient not signing treatment goal plan on computer screen due to Covid. ?Treatment Goals: ? Treatment goals remain on treatment plan as patient works with strategy to achieve her goals. Progress will be assessed each visit and documented in "progress" section of note. ?Long term goal: ?Appropriately manage the left over feelings patient is experiencing from husband's death, in order to normalize mood and return to previous adaptive level of functioning. ?Short term goal: ?Verbally expressed understanding of the relationship between anxiety and depressed mood and repression of feelings, such as anger hurt and sadness. ?Strategy: ?Make positive statements regarding self and ability to cope with stresses of life. ?  ?Diagnosis: ?  ICD-10-CM   ?1. Generalized anxiety disorder  F41.1   ?  ? ?Plan:  Patient today showing good motivation and participated actively in session as she continued work on family, personal, and work stressors and trying to manage them more effectively.  Also addressed some occasional residual grief issues with the loss of her husband, leaving her as a single parent to a high school daughter and college-age son.  Feeling more encouraged as to how she is managing things with her children and and her relationship with each of them.  Some challenges especially with her daughter and patient feels her responses to daughter are more helpful at this point as daughter has matured  some also.  Reviewed stress management, given herself a time out when needed, healthier boundaries, and good parent/child communication skills.  Definite progress in managing previous depression and current anxiety as well as improving self confidence and a positive attitude going forward ?Encouraged patient in her  practice of more positive behaviors including: ?Believing in herself more and her ability to make continued changes for her emotional health. ?Reflecting on her progress more frequently. ?More consistent positive self talk. ?Healthy nutrition and exercise. ?Giving people more of the benefit of the doubt. ?Staying in contact with supportive people. ?Not assuming that she knows what others are always thinking or feeling. ?Using journaling as a tool with her stress, anxiety, and anger. ?Set and keep healthy boundaries with others. ?Stay in the present focusing what she can control. ?Modulate her emotions when dealing with other people or tense situations. ?Continue working on expressing her anger and healthy ways. ?Practice more patience with people and herself. ?Refrain from being judgmental. ?Enjoy spending time with her pet dog who is very therapeutic for her. ?For every negative thought create 2 positives. ?Find the positives within herself and be able to name them. ?Focus more on the positives versus negatives each day. ?Realize the strength she shows working with goal-directed behaviors to move forward in a direction that supports improved emotional health. ? ?Goal review and progress/challenges noted with patient. ? ?Next appointment within 4 weeks. ? ?This record has been created using [employer].  Chart creation errors have been sought, but may not always have been located and corrected.  Such creation errors do not reflect on the standard of medical care provided. ? ? ?Mathis Fare, LCSW ? ? ? ? ? ? ? ? ? ? ? ? ? ? ? ? ? ? ?

## 2022-02-20 ENCOUNTER — Ambulatory Visit: Payer: BC Managed Care – PPO | Admitting: Psychiatry

## 2022-03-27 ENCOUNTER — Ambulatory Visit: Payer: BC Managed Care – PPO | Admitting: Psychiatry

## 2022-04-24 ENCOUNTER — Ambulatory Visit: Payer: BC Managed Care – PPO | Admitting: Psychiatry

## 2023-03-11 IMAGING — MG MM DIGITAL DIAGNOSTIC UNILAT*L* W/ TOMO W/ CAD
6 series · 6 of 18 positions shown · non-contrast
Comparison: Previous exam(s).

CLINICAL DATA: 56-year-old female presenting for evaluation of an
asymmetry in the left breast.

EXAM:
DIGITAL DIAGNOSTIC UNILATERAL LEFT MAMMOGRAM WITH TOMOSYNTHESIS AND
CAD; ULTRASOUND LEFT BREAST LIMITED
TECHNIQUE: Left digital diagnostic mammography and breast tomosynthesis was
performed. The images were evaluated with computer-aided detection.;
Targeted ultrasound examination of the left breast was performed.

[L ML synth-2D]
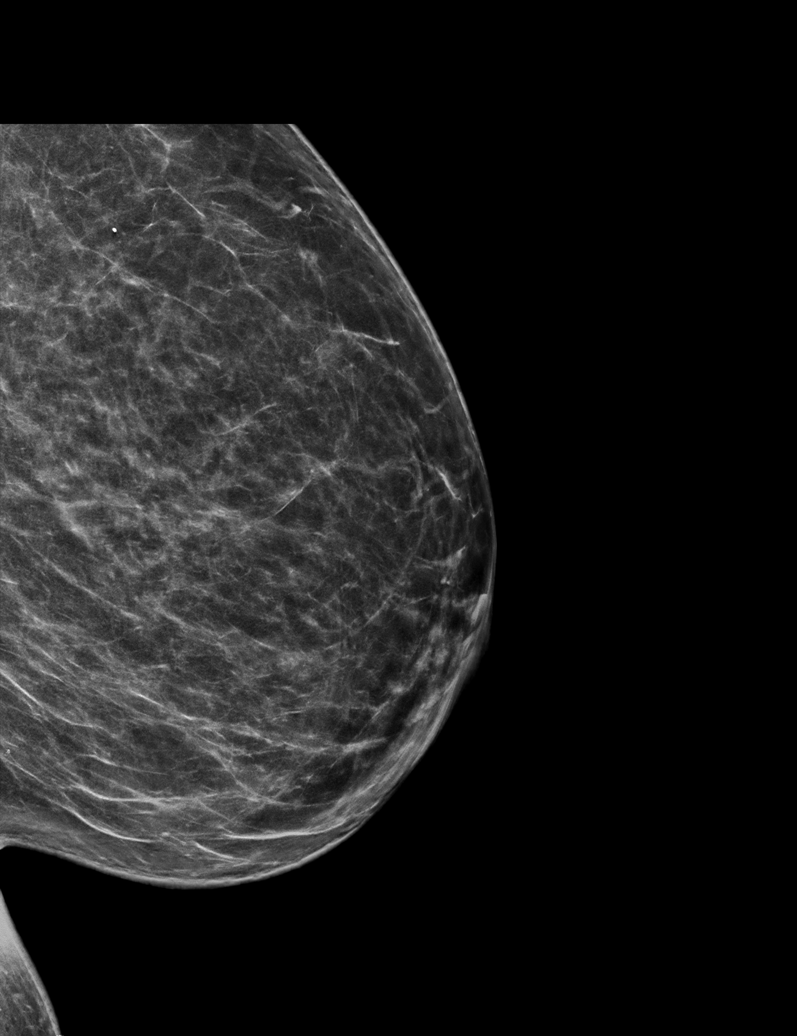

[L MLO synth-2D (1 of 2)]
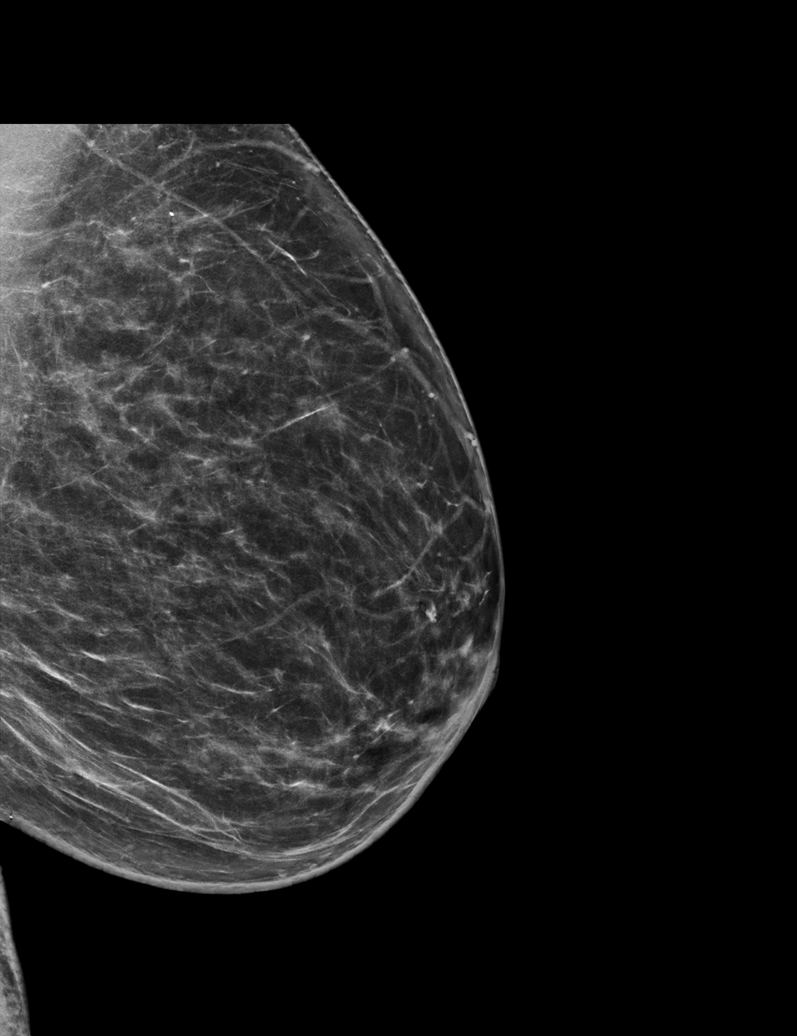

[L MLO synth-2D (2 of 2)]
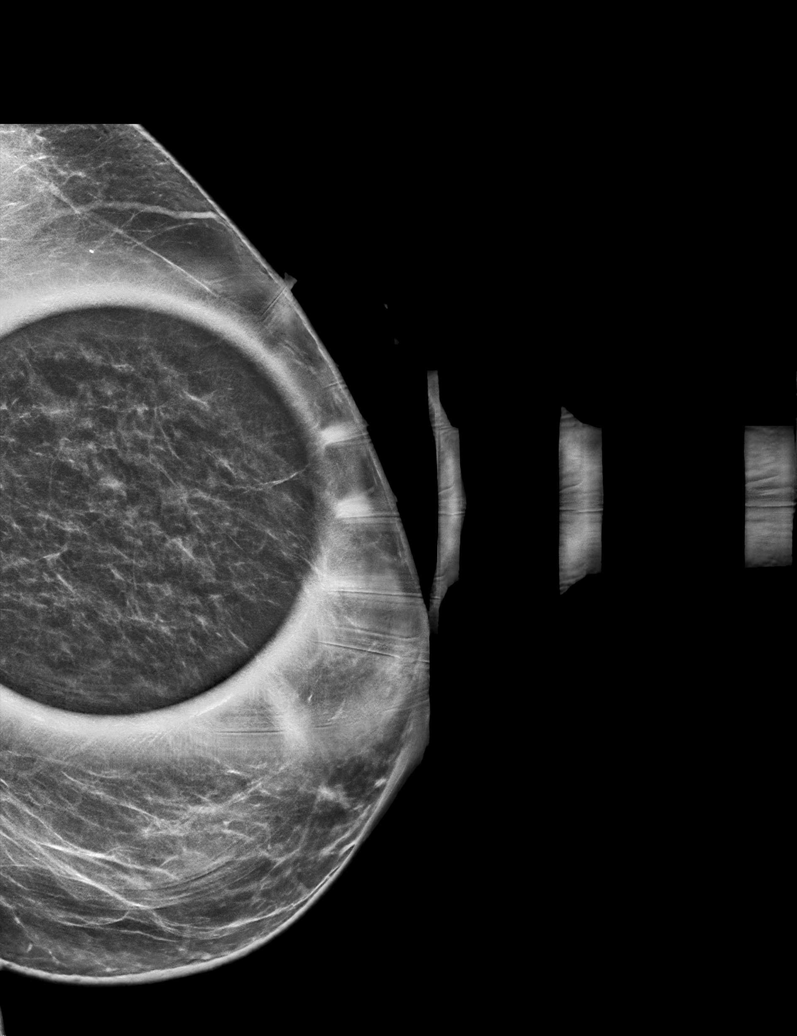

[L ML tomo · tomo slice 35/69.0]
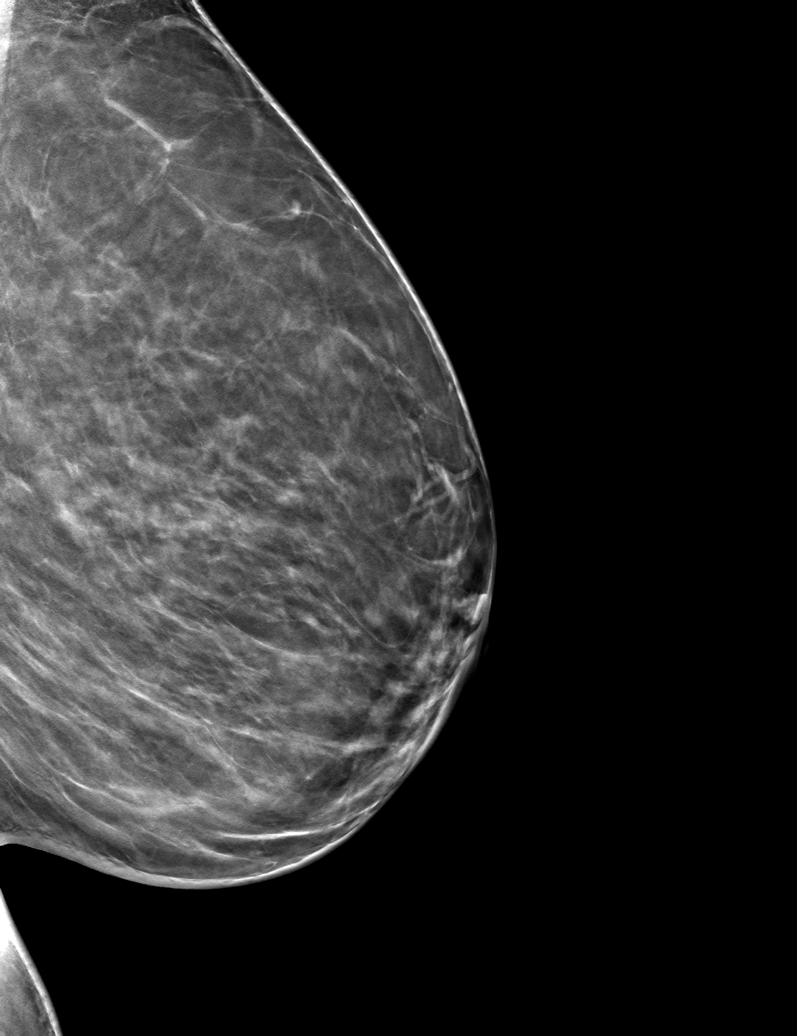

[L MLO tomo (1 of 2) · tomo slice 32/63.0]
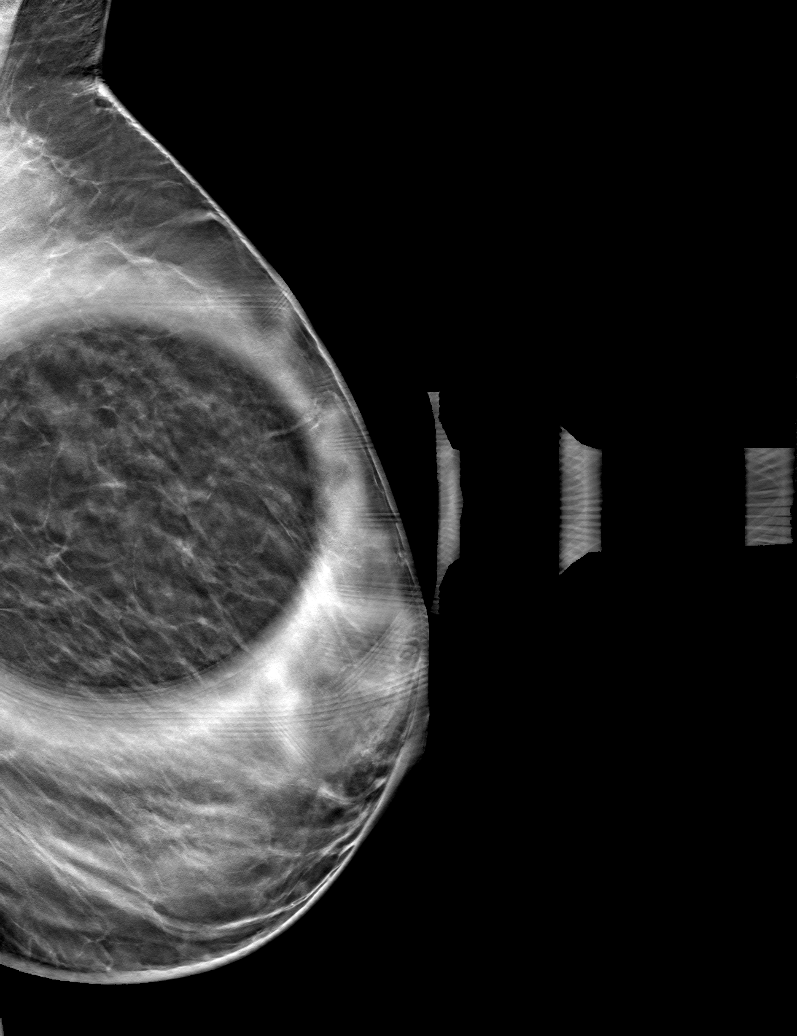

[L MLO tomo (2 of 2) · tomo slice 37/74.0]
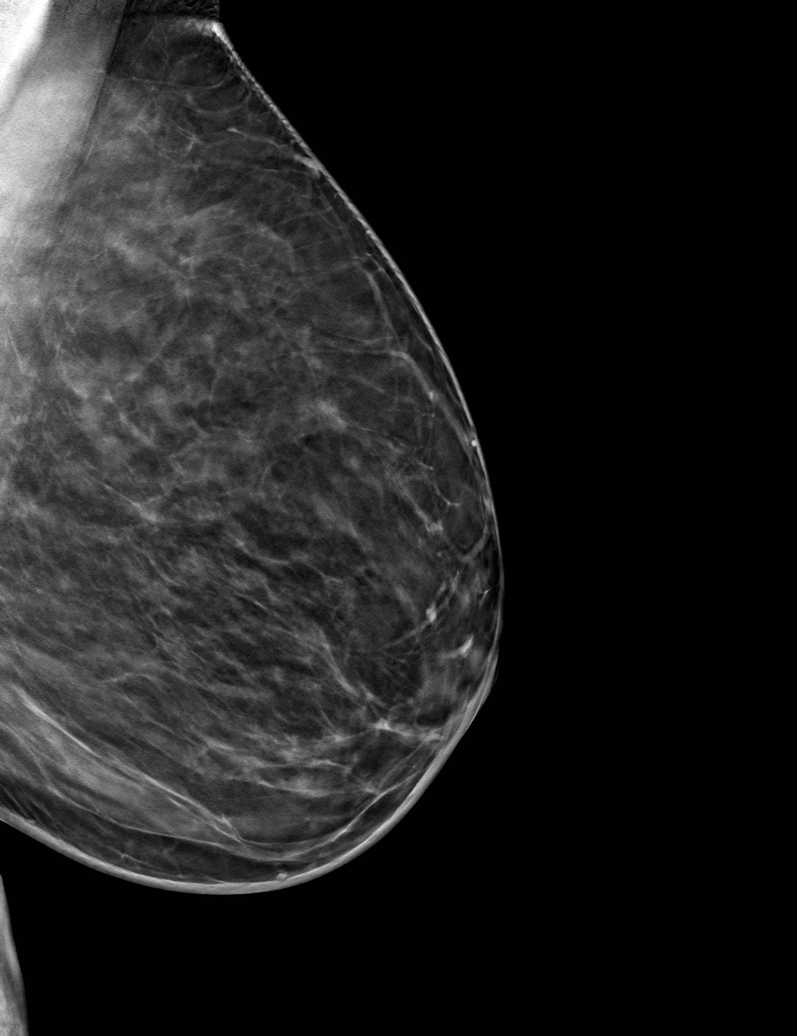

[6 of 18 positions shown; findings below may reference images not displayed]

ACR Breast Density Category b: There are scattered areas of
fibroglandular density.
FINDINGS: Mammogram:

Spot compression MLO, full field mL and full field MLO tomosynthesis
views of the left breast were performed for a questioned asymmetry
seen only on MLO view in the upper inner left breast. On the
additional imaging the tissue in this area disperses without
persistent asymmetry, mass, or true distortion.

Ultrasound:

Targeted ultrasound performed throughout the upper inner aspect of
the left breast demonstrating no cystic or solid mass or focal area
of shadowing.
IMPRESSION: No mammographic or sonographic evidence of malignancy in the left
breast.

RECOMMENDATION:
Screening mammogram in one year.(Code:IO-Y-AMF)

I have discussed the findings and recommendations with the patient.
If applicable, a reminder letter will be sent to the patient
regarding the next appointment.

BI-RADS CATEGORY  1: Negative.

## 2023-03-11 IMAGING — US US BREAST*L* LIMITED INC AXILLA
1 series · 4 of 4 positions shown · non-contrast
Comparison: Previous exam(s).

CLINICAL DATA: 56-year-old female presenting for evaluation of an
asymmetry in the left breast.

EXAM:
DIGITAL DIAGNOSTIC UNILATERAL LEFT MAMMOGRAM WITH TOMOSYNTHESIS AND
CAD; ULTRASOUND LEFT BREAST LIMITED
TECHNIQUE: Left digital diagnostic mammography and breast tomosynthesis was
performed. The images were evaluated with computer-aided detection.;
Targeted ultrasound examination of the left breast was performed.

[Series 1: us breast*left* limited inc axilla · 0.07mm/px · 4 of 4 slices shown]
[im 1/4]
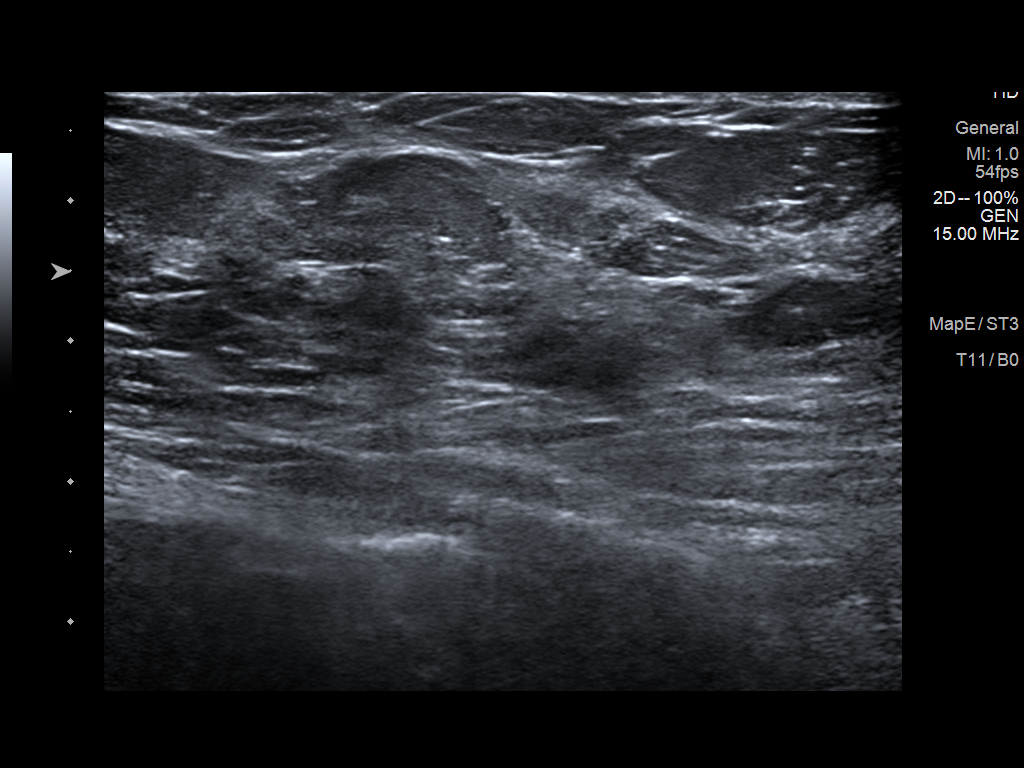
[im 2/4]
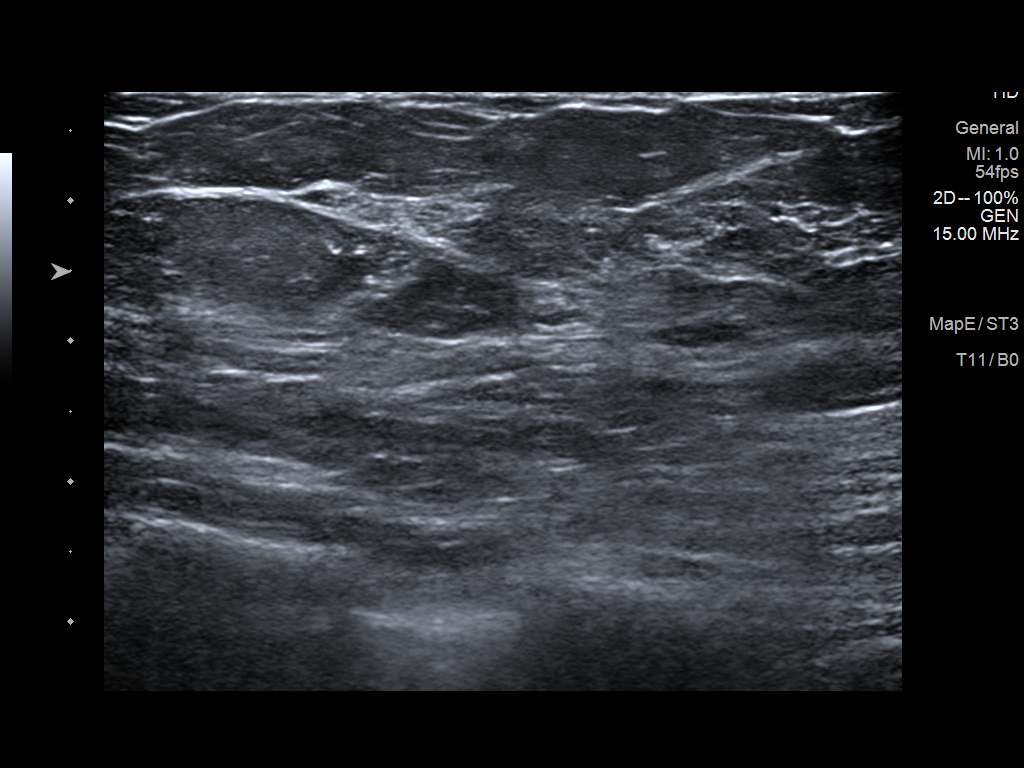
[im 3/4]
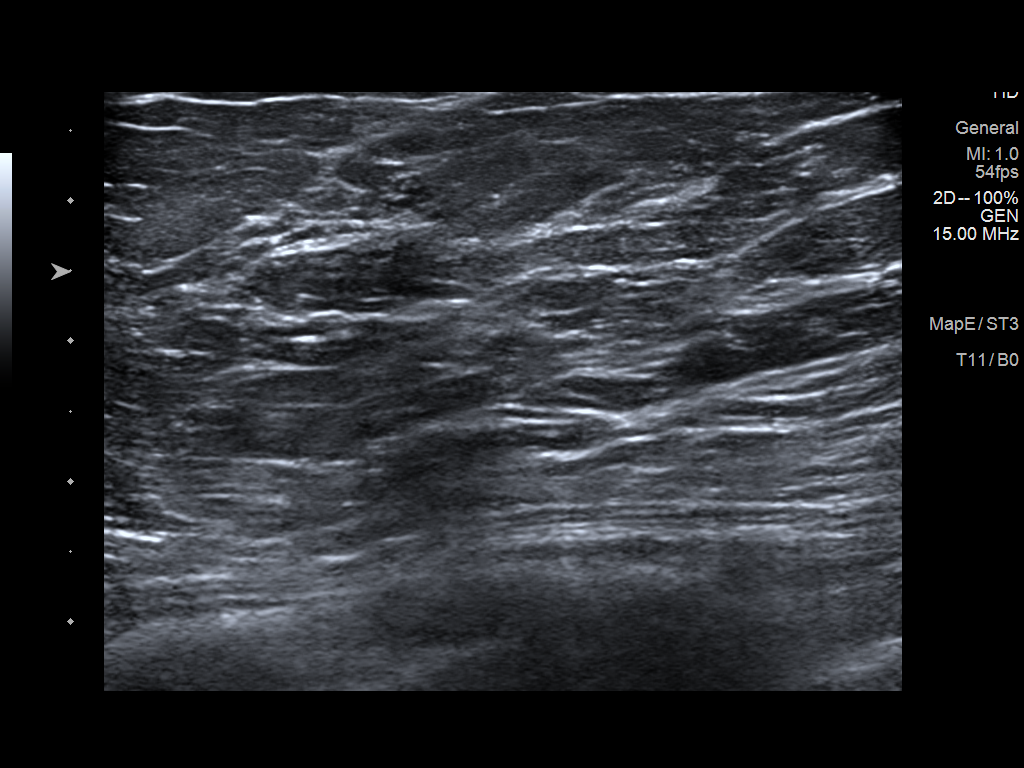
[im 4/4]
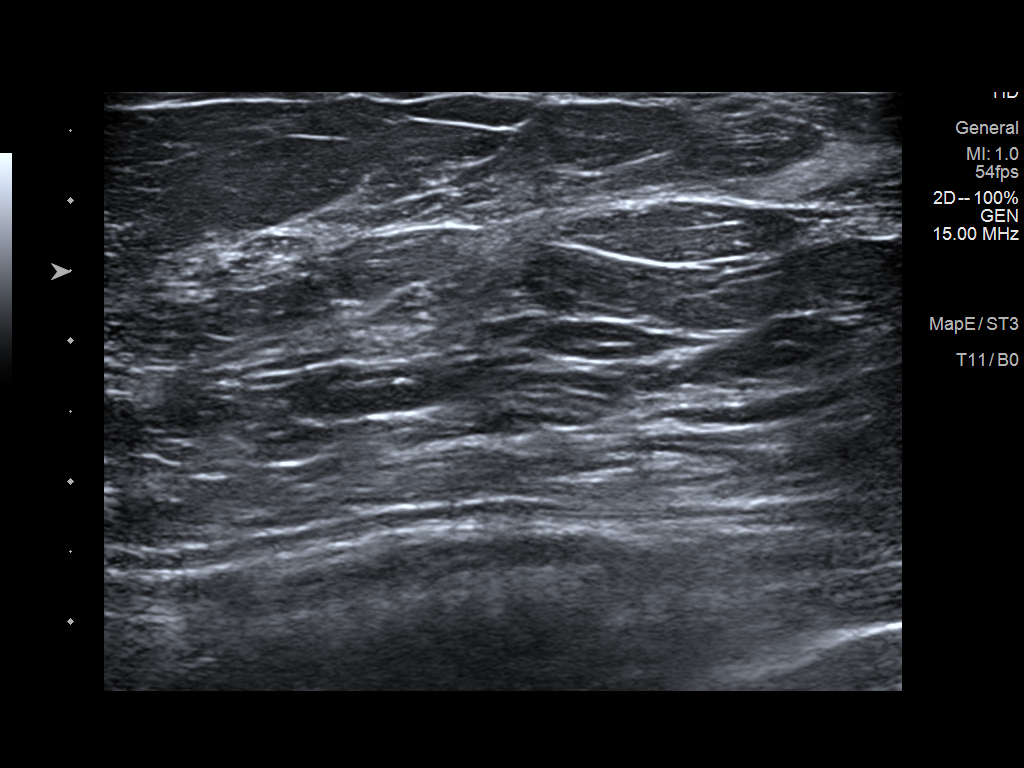

[4 of 4 positions shown; findings below may reference images not displayed]

ACR Breast Density Category b: There are scattered areas of
fibroglandular density.
FINDINGS: Mammogram:

Spot compression MLO, full field mL and full field MLO tomosynthesis
views of the left breast were performed for a questioned asymmetry
seen only on MLO view in the upper inner left breast. On the
additional imaging the tissue in this area disperses without
persistent asymmetry, mass, or true distortion.

Ultrasound:

Targeted ultrasound performed throughout the upper inner aspect of
the left breast demonstrating no cystic or solid mass or focal area
of shadowing.
IMPRESSION: No mammographic or sonographic evidence of malignancy in the left
breast.

RECOMMENDATION:
Screening mammogram in one year.(Code:IO-Y-AMF)

I have discussed the findings and recommendations with the patient.
If applicable, a reminder letter will be sent to the patient
regarding the next appointment.

BI-RADS CATEGORY  1: Negative.

## 2024-08-19 ENCOUNTER — Ambulatory Visit: Payer: Self-pay | Admitting: Physical Therapy

## 2024-09-15 ENCOUNTER — Ambulatory Visit: Admitting: Physical Therapy

## 2024-09-15 DIAGNOSIS — M5459 Other low back pain: Secondary | ICD-10-CM | POA: Diagnosis not present

## 2024-09-15 NOTE — Therapy (Unsigned)
 OUTPATIENT PHYSICAL THERAPY LOWER EXTREMITY EVALUATION   Patient Name: Samantha James MRN: [id number] DOB:07-Jul-1966, 58 y.o., female Today's Date: 09/15/2024  END OF SESSION:   Past Medical History:  Diagnosis Date   Diabetes mellitus without complication (HCC)    Hyperglycemia    Hypertension    Past Surgical History:  Procedure Laterality Date   CESAREAN SECTION  2006   CESAREAN SECTION  2001   There are no active problems to display for this patient. PMH:  Hypercholesterol     PCP: Artist Cohens ([facility])   REFERRING PROVIDER: Cordella Gebauer/[facility] (no follow up)   REFERRING DIAG: Low back pain, Spondylolisthesis   THERAPY DIAG:  No diagnosis found.  Rationale for Evaluation and Treatment: Rehabilitation  ONSET DATE:    SUBJECTIVE:   SUBJECTIVE STATEMENT: Moving kids in college, August- did have increased pain- ON R side.  Mailbox- end of August- bending, had increased pain.   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    PERTINENT HISTORY:   PAIN:  Are you having pain? Yes: NPRS scale: 0/10 not having anymore.  Pain location: R side of back  Pain description: tight/sore  Aggravating factors: bend, squat twist Relieving factors:   PRECAUTIONS: None  WEIGHT BEARING RESTRICTIONS: No  FALLS:  Has patient fallen in last 6 months? No   PLOF: Independent  PATIENT GOALS:  Learn things to help back.   NEXT MD VISIT:   OBJECTIVE:   DIAGNOSTIC FINDINGS:    PATIENT SURVEYS:    COGNITION: Overall cognitive status: Within functional limits for tasks assessed     SENSATION: WFL  EDEMA:  N/A   POSTURE:    No Significant postural limitations  PALPATION:   LOWER EXTREMITY ROM:  Active /Passive ROM Left eval Right eval  Hip flexion    Hip extension    Hip abduction    Hip adduction    Hip internal rotation    Hip external rotation    Knee flexion    Knee extension    Ankle dorsiflexion     Ankle plantarflexion    Ankle inversion    Ankle eversion     (Blank rows = not tested)  LOWER EXTREMITY MMT:  MMT Left eval Right  eval  Hip flexion    Hip extension    Hip abduction    Hip adduction    Hip internal rotation    Hip external rotation    Knee flexion    Knee extension    Ankle dorsiflexion    Ankle plantarflexion    Ankle inversion    Ankle eversion     (Blank rows = not tested)  LOWER EXTREMITY SPECIAL TESTS:  {LEspecialtests:26242}  FUNCTIONAL TESTS:  {Functional tests:24029}  GAIT: unremarkable  TODAY'S TREATMENT:  DATE:  Eval: Ther ex: See below for HEP   PATIENT EDUCATION:  Education details: PT POC, Exam findings, HEP Person educated: Patient Education method: Explanation, Demonstration, Tactile cues, Verbal cues, and Handouts Education comprehension: verbalized understanding, returned demonstration, verbal cues required, tactile cues required, and needs further education   HOME EXERCISE PROGRAM: ***  ASSESSMENT:  CLINICAL IMPRESSION: Patient presents with primary complaint of  ***   Pt with decreased ability for full functional activities. Pt will  benefit from skilled PT to improve deficits and pain and to return to PLOF.   OBJECTIVE IMPAIRMENTS: {opptimpairments:25111}.   ACTIVITY LIMITATIONS: {activitylimitations:27494}  PARTICIPATION LIMITATIONS: {participationrestrictions:25113}  PERSONAL FACTORS: {Personal factors:25162} are also affecting patient's functional outcome.   REHAB POTENTIAL: Good  CLINICAL DECISION MAKING: Stable/uncomplicated  EVALUATION COMPLEXITY: Low   GOALS: Goals reviewed with patient? Yes  SHORT TERM GOALS: Target date: 10/06/2024   Pt to be independent with initial HEP  Goal status: INITIAL  2.  ***  Goal status: INITIAL     LONG TERM GOALS: Target date:  11/10/2024   Pt to be independent with final HEP  Goal status: INITIAL  2.  Pt to report decreased pain to 0-2/10 with activity   Goal status: INITIAL  3.  ***  Goal status: INITIAL  4.  ***  Goal status: INITIAL  5.  ***  Goal status: INITIAL     PLAN:  PT FREQUENCY: 1-2x/week  PT DURATION: 8 weeks  PLANNED INTERVENTIONS: Therapeutic exercises, Therapeutic activity, Neuromuscular re-education, Patient/Family education, Self Care, Joint mobilization, Joint manipulation, Stair training, Orthotic/Fit training, DME instructions, Aquatic Therapy, Dry Needling, Electrical stimulation, Cryotherapy, Moist heat, Taping, Ultrasound, Ionotophoresis 4mg /ml Dexamethasone, Manual therapy,  Vasopneumatic device, Traction, Spinal manipulation, Spinal mobilization,Balance training, Gait training,   PLAN FOR NEXT SESSION: pelvic tilts, TA, core strength,  Squat/ lifting mechanics.    Tinnie Don, PT, DPT 2:54 PM  09/15/24

## 2024-09-16 ENCOUNTER — Encounter: Payer: Self-pay | Admitting: Physical Therapy

## 2024-09-25 ENCOUNTER — Encounter: Payer: Self-pay | Admitting: Physical Therapy

## 2024-09-25 ENCOUNTER — Ambulatory Visit: Admitting: Physical Therapy

## 2024-09-25 DIAGNOSIS — M5459 Other low back pain: Secondary | ICD-10-CM

## 2024-09-25 NOTE — Therapy (Signed)
 OUTPATIENT PHYSICAL THERAPY LOWER EXTREMITY TREATMENT   Patient Name: Samantha James MRN: [id number] DOB:May 09, 1966, 58 y.o., female Today's Date: 09/25/2024  END OF SESSION:  PT End of Session - 09/25/24 1609     Visit Number 2    Number of Visits 16    Date for Recertification  11/10/24    Authorization Type [employer]    PT Start Time 1608    PT Stop Time 1646    PT Time Calculation (min) 38 min    Activity Tolerance Patient tolerated treatment well    Behavior During Therapy WFL for tasks assessed/performed          Past Medical History:  Diagnosis Date   Diabetes mellitus without complication (HCC)    Hyperglycemia    Hypertension    Past Surgical History:  Procedure Laterality Date   CESAREAN SECTION  2006   CESAREAN SECTION  2001   There are no active problems to display for this patient. PMH:  Hypercholesterol     PCP: Artist Cohens ([facility])   REFERRING PROVIDER: Cordella Gebauer/murphy wainer (no follow up)   REFERRING DIAG: Low back pain, Spondylolisthesis   THERAPY DIAG:  Other low back pain  Rationale for Evaluation and Treatment: Rehabilitation  ONSET DATE:    SUBJECTIVE:   SUBJECTIVE STATEMENT: Pt with no new complaints. Has been doing stretches, back is feeling good.   Eval: Pt states increased pain in August, when she was moving one of her kids into college. States significant pain on R side of low back, none on L. Denies radiating pain or numbness/tingling. She has had previous increase in pain doing same type of activities over the past few years. At this time, pain has subsided, but she wanted to come to PT to learn how to help her back.  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    PERTINENT HISTORY: HTN, DM,    PAIN:  Are you having pain? Yes: NPRS scale: 0/10 not having anymore.  Pain location: R side of back  Pain description: tight/sore  Aggravating factors: bend, squat twist Relieving factors:    PRECAUTIONS: None  WEIGHT BEARING RESTRICTIONS: No  FALLS:  Has patient fallen in last 6 months? No   PLOF: Independent  PATIENT GOALS:  Learn things to help back.   NEXT MD VISIT:   OBJECTIVE:   DIAGNOSTIC FINDINGS:    PATIENT SURVEYS:    COGNITION: Overall cognitive status: Within functional limits for tasks assessed     SENSATION: WFL  EDEMA:  N/A   POSTURE:    No Significant postural limitations  PALPATION: Mild pain into R low lumbar, L5, Mild into R SI, no pain into glute  Hypomobile mid thoracic down to low lumbar with PA s   LOWER EXTREMITY ROM: WFL, mild limitation for R hip ER Lumbar: flexion: limited motion coming from back,  Ext: WFL, SB: WFL  LOWER EXTREMITY MMT:  MMT Left eval Right  eval  Hip flexion 4 4  Hip extension    Hip abduction 4 4  Hip adduction    Hip internal rotation    Hip external rotation    Knee flexion 5 5  Knee extension 5 5  Ankle dorsiflexion    Ankle plantarflexion    Ankle inversion    Ankle eversion     (Blank rows = not tested)  LOWER EXTREMITY SPECIAL TESTS:    GAIT: unremarkable  TODAY'S TREATMENT:  DATE:   09/25/24 Therapeutic Exercise: Aerobic: Supine: pelvic tilts x 15;  TA x 10;   TA with march x 10;  TA with SLR 2 x 10;  TA with breathing x 10; breathing x 10 in/out  Seated: education on optimal seated posture  Standing: Stretches: Cat/cow x 10; SKTC x 3 bil;  LTR x 15;  Neuromuscular Re-education: Manual Therapy: Lumbar and thoracic PA mobs, R SI mobs.  Therapeutic Activity: Self Care:   Eval: Ther ex: See below for HEP   PATIENT EDUCATION:  Education details: Updated and reviewed HEP Person educated: Patient Education method: Explanation, Demonstration, Tactile cues, Verbal cues, and Handouts Education comprehension: verbalized understanding, returned  demonstration, verbal cues required, tactile cues required, and needs further education   HOME EXERCISE PROGRAM: Access Code: [facility] URL: [email or web address] Date: 09/16/2024 Prepared by: Tinnie Don  Exercises - Quadruped Cat Cow  - 1-2 x daily - 1 sets - 10 reps - 5 sec  hold - Supine Single Knee to Chest Stretch  - 1-2 x daily - 3 reps - 20 hold - Supine Lower Trunk Rotation  - 1-2 x daily - 10 reps - 5 hold  ASSESSMENT:  CLINICAL IMPRESSION: Pt educated on lumbar posture and positioning. More challenged with posterior pelvic tilt, with tendency for anterior tilt. Good ability for TA contraction, but difficulty with breathing and movement with TA, will benefit from continued work on this.   Eval: Patient presents with primary complaint of pain in R side of low back. She has had several episodes of pain, of which has decreased at this time. Pt has stiffness in thoracic and lumbar spine, with decreased rom. She also has poor core stability and awareness for body mechanics. Pt to benefit from education on long term care for re-occurring low back pain. Pt with decreased ability for full functional activities. Pt will  benefit from skilled PT to improve deficits and pain and to return to PLOF.   OBJECTIVE IMPAIRMENTS: cardiopulmonary status limiting activity, decreased activity tolerance, decreased strength, increased muscle spasms, impaired flexibility, improper body mechanics, and pain.   ACTIVITY LIMITATIONS: carrying, lifting, bending, squatting, and locomotion level  PARTICIPATION LIMITATIONS: cleaning, community activity, and yard work  PERSONAL FACTORS: Past/current experiences and Time since onset of injury/illness/exacerbation are also affecting patient's functional outcome.   REHAB POTENTIAL: Good  CLINICAL DECISION MAKING: Stable/uncomplicated  EVALUATION COMPLEXITY: Low   GOALS: Goals reviewed with patient? Yes  SHORT TERM GOALS: Target date:  10/06/2024   Pt to be independent with initial HEP  Goal status: INITIAL     LONG TERM GOALS: Target date: 11/10/2024   Pt to be independent with final HEP  Goal status: INITIAL  2.  Pt to report decreased pain to 0-2/10 with activity   Goal status: INITIAL  3.  Pt to demo ability and optimal mechanics for bend/lift/squat to improve ability for IADLs without pain.   Goal status: INITIAL  4.  Pt to demo core strength/stability to be [facility] for pt age and activity.   Goal status: INITIAL    PLAN:  PT FREQUENCY: 1-2x/week  PT DURATION: 8 weeks  PLANNED INTERVENTIONS: Therapeutic exercises, Therapeutic activity, Neuromuscular re-education, Patient/Family education, Self Care, Joint mobilization, Joint manipulation, Stair training, Orthotic/Fit training, DME instructions, Aquatic Therapy, Dry Needling, Electrical stimulation, Cryotherapy, Moist heat, Taping, Ultrasound, Ionotophoresis 4mg /ml Dexamethasone, Manual therapy,  Vasopneumatic device, Traction, Spinal manipulation, Spinal mobilization,Balance training, Gait training,   PLAN FOR NEXT SESSION: pelvic tilts, TA education , core strength,  Squat/ lifting mechanics. Long term HEP for back health   Tinnie Don, PT, DPT 4:48 PM  09/25/24

## 2024-09-29 ENCOUNTER — Encounter: Admitting: Physical Therapy

## 2024-10-06 ENCOUNTER — Encounter: Admitting: Physical Therapy

## 2024-10-13 ENCOUNTER — Ambulatory Visit: Admitting: Physical Therapy

## 2024-10-13 ENCOUNTER — Encounter: Admitting: Physical Therapy

## 2024-10-13 DIAGNOSIS — M5459 Other low back pain: Secondary | ICD-10-CM | POA: Diagnosis not present

## 2024-10-13 NOTE — Therapy (Unsigned)
 OUTPATIENT PHYSICAL THERAPY LOWER EXTREMITY TREATMENT   Patient Name: VINAYA SANCHO MRN: [id number] DOB:04-05-1966, 58 y.o., female Today's Date: 10/13/2024  END OF SESSION:  PT End of Session - 10/15/24 1902     Visit Number 3    Number of Visits 16    Date for Recertification  11/10/24    Authorization Type [employer]et[employer]    PT Start Time 1606    PT Stop Time 1647    PT Time Calculation (min) 41 min    Activity Tolerance Patient tolerated treatment well    Behavior During Therapy WFL for tasks assessed/performed           Past Medical History:  Diagnosis Date   Diabetes mellitus without complication (HCC)    Hyperglycemia    Hypertension    Past Surgical History:  Procedure Laterality Date   CESAREAN SECTION  2006   CESAREAN SECTION  2001   There are no active problems to display for this patient. PMH:  Hypercholesterol     PCP: Artist Cohens ([facility]   REFERRING PROVIDER: Cordella Gebauer/murphy wainer (no follow up)   REFERRING DIAG: Low back pain, Spondylolisthesis   THERAPY DIAG:  Other low back pain  Rationale for Evaluation and Treatment: Rehabilitation  ONSET DATE:    SUBJECTIVE:   SUBJECTIVE STATEMENT: Pt with no new complaints. Has not been able to do HEP much. Not having any pain.   Eval: Pt states increased pain in August, when she was moving one of her kids into college. States significant pain on R side of low back, none on L. Denies radiating pain or numbness/tingling. She has had previous increase in pain doing same type of activities over the past few years. At this time, pain has subsided, but she wanted to come to PT to learn how to help her back.  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    PERTINENT HISTORY: HTN, DM,    PAIN:  Are you having pain? Yes: NPRS scale: 0/10 not having anymore.  Pain location: R side of back  Pain description: tight/sore  Aggravating factors: bend, squat twist Relieving factors:    PRECAUTIONS: None  WEIGHT BEARING RESTRICTIONS: No  FALLS:  Has patient fallen in last 6 months? No   PLOF: Independent  PATIENT GOALS:  Learn things to help back.   NEXT MD VISIT:   OBJECTIVE:   DIAGNOSTIC FINDINGS:    PATIENT SURVEYS:    COGNITION: Overall cognitive status: Within functional limits for tasks assessed     SENSATION: WFL  EDEMA:  N/A   POSTURE:    No Significant postural limitations  PALPATION: Mild pain into R low lumbar, L5, Mild into R SI, no pain into glute  Hypomobile mid thoracic down to low lumbar with PA s   LOWER EXTREMITY ROM: WFL, mild limitation for R hip ER Lumbar: flexion: limited motion coming from back,  Ext: WFL, SB: WFL  LOWER EXTREMITY MMT:  MMT Left eval Right  eval  Hip flexion 4 4  Hip extension    Hip abduction 4 4  Hip adduction    Hip internal rotation    Hip external rotation    Knee flexion 5 5  Knee extension 5 5  Ankle dorsiflexion    Ankle plantarflexion    Ankle inversion    Ankle eversion     (Blank rows = not tested)  LOWER EXTREMITY SPECIAL TESTS:    GAIT: unremarkable  TODAY'S TREATMENT:                                                                                                                              DATE:   10/13/24 Therapeutic Exercise: Aerobic: Supine:  bridging 2 x 10;   TA with SLR 2 x 10;    TA with breathing x 10;   breathing x 10 in/out  Seated: education on optimal seated posture  Standing: Stretches: Cat/cow x 10;   SKTC x 3 bil;  LTR x 15;  Neuromuscular Re-education: Manual Therapy:  Therapeutic Activity: Squats 2 x 10, education and practice for hip hinge motion with TA in seated and standing positions  Lift 10lb from 12 in height with optimal mechanics.  Self Care:    Therapeutic Exercise: Aerobic: Supine: pelvic tilts x 15;  TA x 10;   TA with march x 10;  TA with SLR 2 x 10;  TA with breathing x 10; breathing x 10 in/out  Seated: education on  optimal seated posture  Standing: Stretches: Cat/cow x 10; SKTC x 3 bil;  LTR x 15;  Neuromuscular Re-education: Manual Therapy: Lumbar and thoracic PA mobs, R SI mobs.  Therapeutic Activity: Self Care:   Eval: Ther ex: See below for HEP   PATIENT EDUCATION:  Education details: Updated and reviewed HEP Person educated: Patient Education method: Explanation, Demonstration, Tactile cues, Verbal cues, and Handouts Education comprehension: verbalized understanding, returned demonstration, verbal cues required, tactile cues required, and needs further education   HOME EXERCISE PROGRAM: Access Code: [facility] URL: [email or web address] Date: 09/16/2024 Prepared by: Tinnie Don  Exercises - Quadruped Cat Cow  - 1-2 x daily - 1 sets - 10 reps - 5 sec  hold - Supine Single Knee to Chest Stretch  - 1-2 x daily - 3 reps - 20 hold - Supine Lower Trunk Rotation  - 1-2 x daily - 10 reps - 5 hold  ASSESSMENT:  CLINICAL IMPRESSION: Pt educated on lumbar posture for sitting/work positions.  Good ability for TA contraction after education and practice today, but still challenging, and has difficulty with breathing with TA. She will benefit from continued work on this as well as education on bend, lift, squat mechanics.   Eval: Patient presents with primary complaint of pain in R side of low back. She has had several episodes of pain, of which has decreased at this time. Pt has stiffness in thoracic and lumbar spine, with decreased rom. She also has poor core stability and awareness for body mechanics. Pt to benefit from education on long term care for re-occurring low back pain. Pt with decreased ability for full functional activities. Pt will  benefit from skilled PT to improve deficits and pain and to return to PLOF.   OBJECTIVE IMPAIRMENTS: cardiopulmonary status limiting activity, decreased activity tolerance, decreased strength, increased muscle spasms, impaired flexibility,  improper body mechanics, and pain.   ACTIVITY LIMITATIONS: carrying, lifting, bending, squatting, and locomotion  level  PARTICIPATION LIMITATIONS: cleaning, community activity, and yard work  PERSONAL FACTORS: Past/current experiences and Time since onset of injury/illness/exacerbation are also affecting patient's functional outcome.   REHAB POTENTIAL: Good  CLINICAL DECISION MAKING: Stable/uncomplicated  EVALUATION COMPLEXITY: Low   GOALS: Goals reviewed with patient? Yes  SHORT TERM GOALS: Target date: 10/06/2024   Pt to be independent with initial HEP  Goal status: MET    LONG TERM GOALS: Target date: 11/10/2024   Pt to be independent with final HEP  Goal status: INITIAL  2.  Pt to report decreased pain to 0-2/10 with activity   Goal status: INITIAL  3.  Pt to demo ability and optimal mechanics for bend/lift/squat to improve ability for IADLs without pain.   Goal status: INITIAL  4.  Pt to demo core strength/stability to be [facility] for pt age and activity.   Goal status: INITIAL    PLAN:  PT FREQUENCY: 1-2x/week  PT DURATION: 8 weeks  PLANNED INTERVENTIONS: Therapeutic exercises, Therapeutic activity, Neuromuscular re-education, Patient/Family education, Self Care, Joint mobilization, Joint manipulation, Stair training, Orthotic/Fit training, DME instructions, Aquatic Therapy, Dry Needling, Electrical stimulation, Cryotherapy, Moist heat, Taping, Ultrasound, Ionotophoresis 4mg /ml Dexamethasone, Manual therapy,  Vasopneumatic device, Traction, Spinal manipulation, Spinal mobilization,Balance training, Gait training,   PLAN FOR NEXT SESSION: pelvic tilts, TA education , core strength,  Squat/ lifting mechanics. Long term HEP for back health    Tinnie Don, PT, DPT 7:02 PM  10/15/24

## 2024-10-15 ENCOUNTER — Encounter: Payer: Self-pay | Admitting: Physical Therapy
